# Patient Record
Sex: Female | Born: 1950 | Race: White | Hispanic: No | Marital: Married | Smoking: Never smoker
Health system: Southern US, Community
[De-identification: ages and names within clinical notes are randomized; demographics above are authoritative.]

## PROBLEM LIST (undated history)

## (undated) DIAGNOSIS — F988 Other specified behavioral and emotional disorders with onset usually occurring in childhood and adolescence: Secondary | ICD-10-CM

## (undated) DIAGNOSIS — J189 Pneumonia, unspecified organism: Secondary | ICD-10-CM

## (undated) DIAGNOSIS — M1990 Unspecified osteoarthritis, unspecified site: Secondary | ICD-10-CM

## (undated) DIAGNOSIS — E7800 Pure hypercholesterolemia, unspecified: Secondary | ICD-10-CM

## (undated) DIAGNOSIS — J45909 Unspecified asthma, uncomplicated: Secondary | ICD-10-CM

## (undated) HISTORY — PX: TONSILLECTOMY: SUR1361

## (undated) HISTORY — DX: Pure hypercholesterolemia, unspecified: E78.00

## (undated) HISTORY — DX: Unspecified osteoarthritis, unspecified site: M19.90

## (undated) HISTORY — PX: COLONOSCOPY: SHX174

---

## 1998-07-10 ENCOUNTER — Ambulatory Visit (HOSPITAL_COMMUNITY): Admission: RE | Admit: 1998-07-10 | Discharge: 1998-07-10 | Payer: Self-pay | Admitting: *Deleted

## 1999-12-07 ENCOUNTER — Encounter: Payer: Self-pay | Admitting: *Deleted

## 1999-12-07 ENCOUNTER — Ambulatory Visit (HOSPITAL_COMMUNITY): Admission: RE | Admit: 1999-12-07 | Discharge: 1999-12-07 | Payer: Self-pay | Admitting: *Deleted

## 2002-07-16 ENCOUNTER — Ambulatory Visit (HOSPITAL_COMMUNITY): Admission: RE | Admit: 2002-07-16 | Discharge: 2002-07-16 | Payer: Self-pay | Admitting: Gastroenterology

## 2005-07-18 ENCOUNTER — Encounter: Admission: RE | Admit: 2005-07-18 | Discharge: 2005-10-16 | Payer: Self-pay | Admitting: Family Medicine

## 2005-08-22 ENCOUNTER — Other Ambulatory Visit: Admission: RE | Admit: 2005-08-22 | Discharge: 2005-08-22 | Payer: Self-pay | Admitting: *Deleted

## 2013-12-28 ENCOUNTER — Encounter: Payer: Self-pay | Admitting: Sports Medicine

## 2013-12-28 ENCOUNTER — Ambulatory Visit
Admission: RE | Admit: 2013-12-28 | Discharge: 2013-12-28 | Disposition: A | Discharge status: Home / Self Care | Payer: BC Managed Care – PPO | Source: Ambulatory Visit | Attending: Sports Medicine | Admitting: Sports Medicine

## 2013-12-28 ENCOUNTER — Ambulatory Visit (INDEPENDENT_AMBULATORY_CARE_PROVIDER_SITE_OTHER): Payer: BC Managed Care – PPO | Admitting: Sports Medicine

## 2013-12-28 ENCOUNTER — Encounter (INDEPENDENT_AMBULATORY_CARE_PROVIDER_SITE_OTHER): Payer: Self-pay

## 2013-12-28 VITALS — BP 145/84 | Ht 66.0 in | Wt 135.0 lb

## 2013-12-28 DIAGNOSIS — M25562 Pain in left knee: Secondary | ICD-10-CM

## 2013-12-28 DIAGNOSIS — M25569 Pain in unspecified knee: Secondary | ICD-10-CM

## 2013-12-28 DIAGNOSIS — M1710 Unilateral primary osteoarthritis, unspecified knee: Secondary | ICD-10-CM

## 2013-12-28 DIAGNOSIS — IMO0002 Reserved for concepts with insufficient information to code with codable children: Secondary | ICD-10-CM

## 2013-12-28 DIAGNOSIS — G8929 Other chronic pain: Secondary | ICD-10-CM | POA: Insufficient documentation

## 2013-12-28 DIAGNOSIS — M1712 Unilateral primary osteoarthritis, left knee: Secondary | ICD-10-CM | POA: Insufficient documentation

## 2013-12-28 MED ORDER — TRAMADOL HCL 50 MG PO TABS: 50.0000 mg | ORAL_TABLET | Freq: Two times a day (BID) | ORAL | Status: DC

## 2013-12-28 NOTE — Assessment & Plan Note (Signed)
I think she needs to use supportive brace  We may want to add more shoe support for tennis  SLR program  Reck in 2 mos and see if progressing - consider [place]

## 2013-12-28 NOTE — Assessment & Plan Note (Signed)
Uses a lot of ibuprofen Also lot of tylenol  I think trial of tramadol makes more sense

## 2013-12-28 NOTE — Progress Notes (Signed)
Patient ID: Nichole [name], female   DOB: 02/24/1951, 63 y.o.   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  Last 6 mos swelling Less active and favors this more  Medical Issues Arthritis in several areas - knee, feet, hands, wrist, back Sees Dr Patrecia Pour for OA Vioxx bumped LFTs  Meds  Advil 600 bid with tylenol 1000 bid Ritalin 10 mgm tid  Fam Hx Mom, dad most siblings had OA Colon Cancer  PEXAM Fit in NAD BP 145/84  Ht 5\' 6"  (1.676 m)  Wt 135 lb (61.236 kg)  BMI 21.80 kg/m2  LT Knee There is medial instability on valgus stress Other ligaments are stable Medial jt line hypertrophy Mild pain on McMurry Good flex and extension Mild effusion Varus thrust on walking  RT Knee Knee: Normal to inspection with no erythema or effusion or obvious bony abnormalities. Palpation normal with no warmth or joint line tenderness or patellar tenderness or condyle tenderness. ROM normal in flexion and extension and lower leg rotation. Ligaments with solid consistent endpoints including ACL, PCL, LCL, MCL. Negative Mcmurray's and provocative meniscal tests. Non painful patellar compression. Patellar and quadriceps tendons unremarkable. Hamstring and quadriceps strength is normal.  Hip abductors strong bilaterally Quads are pretty strong but weaker on left  Leg length equal

## 2014-02-14 ENCOUNTER — Ambulatory Visit (INDEPENDENT_AMBULATORY_CARE_PROVIDER_SITE_OTHER): Payer: BC Managed Care – PPO | Admitting: Family Medicine

## 2014-02-14 ENCOUNTER — Encounter: Payer: Self-pay | Admitting: Family Medicine

## 2014-02-14 VITALS — BP 106/71 | Ht 66.0 in | Wt 138.0 lb

## 2014-02-14 DIAGNOSIS — IMO0002 Reserved for concepts with insufficient information to code with codable children: Secondary | ICD-10-CM

## 2014-02-14 DIAGNOSIS — G8929 Other chronic pain: Secondary | ICD-10-CM

## 2014-02-14 DIAGNOSIS — M1712 Unilateral primary osteoarthritis, left knee: Secondary | ICD-10-CM

## 2014-02-14 DIAGNOSIS — M1710 Unilateral primary osteoarthritis, unspecified knee: Secondary | ICD-10-CM

## 2014-02-14 DIAGNOSIS — M25562 Pain in left knee: Secondary | ICD-10-CM

## 2014-02-14 DIAGNOSIS — M25569 Pain in unspecified knee: Secondary | ICD-10-CM

## 2014-02-14 NOTE — Assessment & Plan Note (Signed)
Worsening knee pain. I reviewed her films and her [place] pictures with her. She has a small effusion. I'm not sure why she's having increased symptoms but I suspect part of it is significant worry over what is happening to her knee. We discussed options which include continue brace, increased icing, or more aggressive approach such as aspiration plus/ minus injection of corticosteroid, and she also wanted to discuss possible need for MRI. We discussed all these ultimately she decided to go for the MRI which we will set up and I will discuss with her once I get the results back. In the interim I would recommend she get back to activities. I would recommend she continue  the open patella knee brace with activity.

## 2014-02-14 NOTE — Patient Instructions (Signed)
You have been scheduled for a MRI of your left knee on 02/18/14 at 7:45 am  [facility] [place]  [phone]

## 2014-02-14 NOTE — Progress Notes (Signed)
   Subjective:    Patient ID: Nichole [name], female    DOB: 1950/12/30, 63 y.o.   MRN: [id number]  HPI  Left knee pain worse over the last 1-2 weeks. About 2 weeks ago she noticed increasing pain with tennis so she stopped playing tennis. She then noticed pain with weightbearing and doing everyday activities like shopping for groceries periods are start wearing a brace which seemed to help some. Now feels like her kneecap as well as lot off her knee most of the time she's very worried. She's had no new injury.  PERTINENT  PMH / PSH: No personal Hx DM Not a smoker No hx of left knee injury or surgery Review of Systems She denies fever, sweats, chills, erythema or warmth of the left knee joint.    Objective:   Physical Exam Vital signs are reviewed GENERAL: Well-developed female no acute distress in KNEES: Bilaterally she has him her point patella. Left knee reveals a small effusion. Significant amount of apprehension by patient with exam. Mild tenderness to palpation of the medial joint line. Mild laxity on valgus stress otherwise ligamentously intact. Mild crepitus on patellar grind a significant amount of pain and hypertension. Mild pain on McMurray. She has some pain on full extension but has full extension and normal strength. The extensor 5 out of 5 bilaterally symmetrical as her hamstrings. GAIT: Mildly antalgic. [place]: Medial meniscus is seen well. Lateral meniscus is not seen as well. There is some fluid  in the suprapatellar pouch. Superior pole of  patella shows a small osteophyte. Quadricep and patellar tendon are intact.       Assessment & Plan:

## 2014-02-18 ENCOUNTER — Ambulatory Visit
Admission: RE | Admit: 2014-02-18 | Discharge: 2014-02-18 | Disposition: A | Discharge status: Home / Self Care | Payer: BC Managed Care – PPO | Source: Ambulatory Visit | Attending: Family Medicine | Admitting: Family Medicine

## 2014-02-18 DIAGNOSIS — M25562 Pain in left knee: Secondary | ICD-10-CM

## 2014-02-22 ENCOUNTER — Telehealth: Payer: Self-pay | Admitting: Family Medicine

## 2014-02-22 ENCOUNTER — Encounter: Payer: Self-pay | Admitting: Family Medicine

## 2014-02-22 NOTE — Telephone Encounter (Signed)
In conversation with her.. Results of MRI. Specifically she has a fairly focal area of significant cartilage loss. Think this is probably what is causing her recent symptoms. Menisci actually pretty good. As her desire is to get back to tennis, we discussed options. Certainly she could do nothing, we also discussed steroid injection versus hyaluronic acid injection versus orthopedic referral evaluation. Ultimately a think  she may need some surgical approach and that might be a hemiarthroplasty and I don't know that she wants to go that far yet. I gave her several options issues with think about them let us know.

## 2014-03-01 ENCOUNTER — Ambulatory Visit (INDEPENDENT_AMBULATORY_CARE_PROVIDER_SITE_OTHER): Payer: BC Managed Care – PPO | Admitting: Sports Medicine

## 2014-03-01 ENCOUNTER — Encounter: Payer: Self-pay | Admitting: Sports Medicine

## 2014-03-01 VITALS — BP 120/74

## 2014-03-01 DIAGNOSIS — M1712 Unilateral primary osteoarthritis, left knee: Secondary | ICD-10-CM

## 2014-03-01 DIAGNOSIS — IMO0002 Reserved for concepts with insufficient information to code with codable children: Secondary | ICD-10-CM

## 2014-03-01 DIAGNOSIS — M1710 Unilateral primary osteoarthritis, unspecified knee: Secondary | ICD-10-CM

## 2014-03-01 NOTE — Progress Notes (Signed)
Patient ID: Nichole [name], female   DOB: [birth year], 63 y.o.   MRN: [id number]  Here for left knee OA  MRI confirms a lot of medial compartment loss There is also edema along the medial compartment and some cystic change There is extrusion of the meniscus along the medial anterior joint line  Patient would like to return to playing tennis. She does feel that left knee seems somewhat unstable. Ibuprofen has done a good job relieving her pain. Tramadol did not do as well.  She occasionally gets swelling but not a significant amount. She does not get locking giving way or mechanical symptoms.  A brace with medial and lateral restraints does make her feel more confident  Physical exam No acute distress  LT Knee: Normal to inspection with no erythema or effusion Mild bony hypertrophy along med jt line  Palpation normal with no warmth or joint line tenderness or patellar tenderness or condyle tenderness. ROM normal in flexion and extension and lower leg rotation. On the left she has 145 deg of flexion and gets some pain/ on the RT this is 160 She has joint laxity and so this is a normal ROM but less than on RT  Ligaments with solid consistent endpoints including ACL, PCL, LCL Valgus stress causes the medial compartment to sublux and patient feels this instability when testing MCL . Negative Mcmurray's and provocative meniscal tests. Mildly painful patellar compression. Patellar and quadriceps tendons unremarkable. Hamstring and quadriceps strength is normal.  MRI reviewed On Anterior medial aspect MCL is stretched and meniscus extruded Bony edema and G 4 loss of cartilage as above Minimal spurring

## 2014-03-01 NOTE — Assessment & Plan Note (Signed)
She was advised that in spite of the fact that she has significant arthritis she still functions very well on the left knee and does not have excessive amounts of pain  I think she can keep playing tennis but should be more on soft surfaces She probably needs to alternate every other day I gave her some exercises She should use a compression sleeve but also use the brace with the medial stabilizer when she does play tennis  Since she gets best relief with ibuprofen I suggested she should take enough of this to control her symptoms but if she has to take quite a bit she should be monitored by Dr. Drema Dallas on yearly intervals  She will see me when necessary

## 2014-03-01 NOTE — Patient Instructions (Signed)
Try using a body helix full knee/ small  For tennis use the brace over this to give double protection  Keep up the stationary bike and quad strength work but not too much knee flexion  Don't do squats or lunges  After sport leave on the compression for another 30 minutes and ice  For tennis biggest risk is too deep a knee flexion or moving too quickly side to side  Keep up ibuprofen/ monitor labs with that  Periodically check with me and we will make modifications

## 2014-08-19 ENCOUNTER — Other Ambulatory Visit: Payer: Self-pay | Admitting: Orthopedic Surgery

## 2014-08-19 DIAGNOSIS — M25562 Pain in left knee: Secondary | ICD-10-CM

## 2014-08-31 ENCOUNTER — Ambulatory Visit
Admission: RE | Admit: 2014-08-31 | Discharge: 2014-08-31 | Disposition: A | Discharge status: Home / Self Care | Payer: Commercial Managed Care - HMO | Source: Ambulatory Visit | Attending: Orthopedic Surgery | Admitting: Orthopedic Surgery

## 2014-08-31 ENCOUNTER — Encounter (INDEPENDENT_AMBULATORY_CARE_PROVIDER_SITE_OTHER): Payer: Self-pay

## 2014-08-31 DIAGNOSIS — M25562 Pain in left knee: Secondary | ICD-10-CM

## 2014-09-07 ENCOUNTER — Ambulatory Visit (INDEPENDENT_AMBULATORY_CARE_PROVIDER_SITE_OTHER): Payer: Commercial Managed Care - HMO | Admitting: Neurology

## 2014-09-07 ENCOUNTER — Encounter: Payer: Self-pay | Admitting: Neurology

## 2014-09-07 VITALS — BP 124/71 | HR 65 | Temp 97.9°F | Ht 66.0 in | Wt 143.0 lb

## 2014-09-07 DIAGNOSIS — F988 Other specified behavioral and emotional disorders with onset usually occurring in childhood and adolescence: Secondary | ICD-10-CM

## 2014-09-07 DIAGNOSIS — M25569 Pain in unspecified knee: Secondary | ICD-10-CM

## 2014-09-07 DIAGNOSIS — M25562 Pain in left knee: Secondary | ICD-10-CM

## 2014-09-07 DIAGNOSIS — G4710 Hypersomnia, unspecified: Secondary | ICD-10-CM

## 2014-09-07 NOTE — Progress Notes (Signed)
Subjective:    Patient ID: Nichole [name] is a 63 y.o. female.  HPI    Star Age, MD, PhD [facility] [address] [place], [place] [address]  Dear Dr. Drema Dallas,     I saw your patient, Nichole [name], upon your kind request in my neurologic clinic today for initial consultation of her sleep disorder, in particular, her excessive daytime somnolence. The patient is unaccompanied today. As you know, Nichole [name] is a 63 year old right-handed woman with an underlying medical history of seasonal asthma, ADD (on Ritalin 10 mg tid per her Gyn) and osteoarthritis (scheduled for left knee surgery next month under Dr. Maureen Ralphs), who reported excessive sleepiness for the past many years. She has been on Ritalin for over 20 years and has noted that with Ritalin she focusses well and feels better, but whenever she reduces it or ran out of it, she becomes very sleep.  However, she recalls, that in her 89s, when she was not on Ritalin, she would be very sleepy, driving for one hour to get home and often came close to falling asleep at the wheel.  She is one of 6 children, and there are mood disorder, substance abuse in her siblings, and suspected ADD. Her son has ADD.  Her typical bedtime is reported to be around 11:30 PM and usual wake time is around 6:30 to 7 AM. Sleep onset typically occurs within 15 minutes. She reports feeling marginally rested upon awakening. She wakes up on an average 1 times in the middle of the night and has to go to the bathroom 1 times on a typical night. She denies morning headaches.  She reports excessive daytime somnolence (EDS) without Ritalin and with Ritalin her ESS is 4/24, and she estimates her ESS without Ritalin to be 10/24. She has not fallen asleep while driving. The patient has not been taking a planned nap, but has napped when she ran out of Ritalin. She does not recall dreaming in a nap.  She has been known to snore for the  past few years. Snoring is reportedly mild, and not associated with choking sounds and witnessed apneas. The patient denies a sense of choking or strangling feeling. There is no report of nighttime reflux, with no nighttime cough experienced. The patient has not noted any RLS symptoms and is not known to kick while asleep or before falling asleep. There is no family history of RLS or OSA or narcolepsy.  She denies cataplexy, sleep paralysis, hypnagogic or hypnopompic hallucinations, or sleep attacks. She does not report any vivid dreams, nightmares, dream enactments, or parasomnias, such as sleep talking or sleep walking. The patient has not had a sleep study or a home sleep test.  She consumes 3 caffeinated beverages per day, usually in the form of coffee in the morning.  Her bedroom is usually dark and cool. There is a TV in the bedroom and usually it is not on at night.   Her Past Medical History Is Significant For: Past Medical History  Diagnosis Date  . High cholesterol     Her Past Surgical History Is Significant For: Past Surgical History  Procedure Laterality Date  . Tonsillectomy      age 61    Her Family History Is Significant For: Family History  Problem Relation Age of Onset  . Cancer Mother     uterine  . Dementia Mother   . Cancer - Colon Father   . COPD Father   .  Fibromyalgia Sister   . Cancer - Colon Brother   . Hepatitis C Brother   . Cancer Maternal Grandmother     kidney  . Dementia Maternal Grandfather   . Neuropathy Son 30    multi focal motor neuropathy    Her Social History Is Significant For: History   Social History  . Marital Status: Married    Spouse Name: [name]    Number of Children: 2  . Years of Education: BS   Occupational History  .      self employed   Social History Main Topics  . Smoking status: Never Smoker   . Smokeless tobacco: Never Used  . Alcohol Use: No  . Drug Use: No  . Sexual Activity: None   Other Topics Concern  .  None   Social History Narrative   Patient is right handed and resides with husband, consumes 2-3 cups of caffeine daily    Her Allergies Are:  Allergies  Allergen Reactions  . Sulfa Antibiotics     Hives and swelling  :   Her Current Medications Are:  Outpatient Encounter Prescriptions as of 09/07/2014  Medication Sig  . Acetaminophen (TYLENOL EXTRA STRENGTH PO) Take 500 mg by mouth. 2 daily  . aspirin 81 MG tablet Take 81 mg by mouth daily.  . Calcium Citrate-Vitamin D (CALCIUM CITRATE + D3 PO) Take by mouth. One am, one pm  . cholecalciferol (VITAMIN D) 1000 UNITS tablet Take 1,000 Units by mouth daily.  . Fish Oil OIL by Does not apply route. Twice daily  . ibuprofen (ADVIL,MOTRIN) 200 MG tablet Take 200 mg by mouth every 6 (six) hours as needed. Total of 6 daily  . loratadine (CLARITIN) 10 MG tablet Take 10 mg by mouth daily.  . methylphenidate (RITALIN) 10 MG tablet Take 10 mg by mouth 3 (three) times daily with meals.  . Red Yeast Rice Extract (RED YEAST RICE PO) Take by mouth 2 (two) times daily. Plus COQ10  . [DISCONTINUED] traMADol (ULTRAM) 50 MG tablet Take 1 tablet (50 mg total) by mouth 2 (two) times daily.  :  Review of Systems:  Out of a complete 14 point review of systems, all are reviewed and negative with the exception of these symptoms as listed below:   Review of Systems  Musculoskeletal: Positive for joint swelling.       Joint pain  Neurological:       Sleepiness  Psychiatric/Behavioral:       Decreased energy    Objective:  Neurologic Exam  Physical Exam Physical Examination:   Filed Vitals:   09/07/14 1013  BP: 124/71  Pulse: 65  Temp: 97.9 F (36.6 C)    General Examination: The patient is a very pleasant 63 y.o. female in no acute distress. She appears well-developed and well-nourished and well groomed.   HEENT: Normocephalic, atraumatic, pupils are equal, round and reactive to light and accommodation. Funduscopic exam is normal with  sharp disc margins noted. Extraocular tracking is good without limitation to gaze excursion or nystagmus noted. Normal smooth pursuit is noted. Hearing is grossly intact. Tympanic membranes are clear bilaterally. Face is symmetric with normal facial animation and normal facial sensation. Speech is clear with no dysarthria noted. There is no hypophonia. There is no lip, neck/head, jaw or voice tremor. Neck is supple with full range of passive and active motion. There are no carotid bruits on auscultation. Oropharynx exam reveals: mild mouth dryness, adequate dental hygiene and mild airway crowding, due  to narrow airway and redundant soft palate.   Chest: Clear to auscultation without wheezing, rhonchi or crackles noted.  Heart: S1+S2+0, regular and normal without murmurs, rubs or gallops noted.   Abdomen: Soft, non-tender and non-distended with normal bowel sounds appreciated on auscultation.  Extremities: There is no pitting edema in the distal lower extremities bilaterally. Pedal pulses are intact.  Skin: Warm and dry without trophic changes noted. There are no varicose veins.  Musculoskeletal: exam reveals no obvious joint deformities, tenderness or joint swelling or erythema, with the exception of left knee pain and she is wearing a soft elastic brace around it. Her left distal leg is slightly larger in caliber than the right with no obvious varicose vein or swelling or pitting edema per se.   Neurologically:  Mental status: The patient is awake, alert and oriented in all 4 spheres. Her immediate and remote memory, attention, language skills and fund of knowledge are appropriate. There is no evidence of aphasia, agnosia, apraxia or anomia. Speech is clear with normal prosody and enunciation. Thought process is linear. Mood is normal and affect is normal.  Cranial nerves II - XII are as described above under HEENT exam. In addition: shoulder shrug is normal with equal shoulder height noted. Motor  exam: Normal bulk, strength and tone is noted. There is no drift, tremor or rebound. Romberg is negative. Reflexes are 2+ throughout. Babinski: Toes are flexor bilaterally. Fine motor skills and coordination: intact with normal finger taps, normal hand movements, normal rapid alternating patting, normal foot taps and normal foot agility.  Cerebellar testing: No dysmetria or intention tremor on finger to nose testing. Heel to shin is unremarkable bilaterally, with the exception of mild pain reported in her left knee. There is no truncal or gait ataxia.  Sensory exam: intact to light touch, pinprick, vibration, temperature sense in the upper and lower extremities.  Gait, station and balance: She stands easily. No veering to one side is noted. No leaning to one side is noted. Posture is age-appropriate and stance is narrow based. Gait shows normal stride length and normal pace, but has a slight limp on the L. No problems turning are noted. She turns en bloc. Tandem walk is unremarkable. Intact toe and heel stance is noted.               Assessment and Plan:   In summary, Nichole [name] is a very pleasant 63 y.o.-year old female with an underlying medical history of seasonal asthma, ADD (on Ritalin 10 mg tid per her Gyn) and osteoarthritis (scheduled for left knee surgery next month under Dr. Maureen Ralphs), who reports a long-standing history of excessive somnolence. She does not have a telltale history concerning for narcolepsy. She has been on Ritalin for over 20 years and whenever she runs out of it or skips a dose she becomes quite lethargic but she has never actually stopped it for any consistent amount of time. Looking back in her history when she was in her 69s and became quite sleepy while driving for an hour, this does raise a suspicion for an underlying sleepiness disorder such as idiopathic hypersomnolence. Again, her history is certainly not telltale for narcolepsy. To further delineate her sleepiness, I  suggested she taper off of Ritalin and be off of it for at least 10-14 days and we can schedule her for a overnight polysomnogram followed by a nap study which would be the gold standard to further determine the degree of daytime somnolence and sleepiness  she may have. Given that she has a surgery coming up she would like to hold off on sleep testing and perhaps consider it when she has undergone her scheduled knee surgery and has recovered from it. I agree that there is no pressing reason that we need to test her before her surgery. Her physical and neurological exam are nonfocal with the exception of left knee pain and mild difficulty with range of motion in her left leg.  At this juncture, I have advised patient to call us back when she is ready to embark on further testing. I would be happy to see her back after that. She was in agreement. I answered all her questions today in particular also explaining the sleep test procedure and the MSLT.  Thank you very much for allowing me to participate in the care of this nice lady. If I can be of further assistance to you please do not hesitate to e-mail or call.  Star Age, MD, PhD

## 2014-09-07 NOTE — Patient Instructions (Addendum)
We will consider sleep testing in the near future. Please call us when you are ready to schedule your sleep tests. You would have to taper off of ritalin: Take 1 pill 2 times a day for 1 week, then 1 pill once daily for 1 week, then 1 pill every other day for 1 week, then stop. You will have to be off of it for at least 10 days. You should not be on an antidepressant, narcotic pain medication or a valium like medication for this time either.

## 2014-10-18 ENCOUNTER — Ambulatory Visit: Payer: Self-pay | Admitting: Orthopedic Surgery

## 2014-10-18 NOTE — Progress Notes (Signed)
Preoperative surgical orders have been place into the Epic hospital system for Nichole [name] on 10/18/2014, 5:38 PM  by Mickel Crow for surgery on 11/07/2014.  Preop Uni Knee orders including Experal, IV Tylenol, and IV Decadron as long as there are no contraindications to the above medications. Arlee Muslim, PA-C

## 2014-10-26 NOTE — Patient Instructions (Addendum)
Nichole [name]  10/26/2014   Your procedure is scheduled on:  11/07/2014    Come thru the [facility].  .  Follow the Signs to [facility] at  [place]      am  Call this number if you have problems the morning of surgery: [phone]   Remember:   Do not eat food or drink liquids after midnight.   Take these medicines the morning of surgery with A SIP OF WATER: Use Albuterol Inhaler if needed. Bring Inhaler with you, Claritin    Do not wear jewelry, make-up or nail polish.  Do not wear lotions, powders, or perfumes.  deodorant.  Do not shave 48 hours prior to surgery.   Do not bring valuables to the hospital.  Contacts, dentures or bridgework may not be worn into surgery.  Leave suitcase in the car. After surgery it may be brought to your room.  For patients admitted to the hospital, checkout time is 11:00 AM the day of  discharge.     Please read over the following fact sheets that you were given: MRSA Information, coughing and deep breathing exercises, leg exercises             - Preparing for Surgery Before surgery, you can play an important role.  Because skin is not sterile, your skin needs to be as free of germs as possible.  You can reduce the number of germs on your skin by washing with CHG (chlorahexidine gluconate) soap before surgery.  CHG is an antiseptic cleaner which kills germs and bonds with the skin to continue killing germs even after washing. Please DO NOT use if you have an allergy to CHG or antibacterial soaps.  If your skin becomes reddened/irritated stop using the CHG and inform your nurse when you arrive at Short Stay. Do not shave (including legs and underarms) for at least 48 hours prior to the first CHG shower.  You may shave your face/neck. Please follow these instructions carefully:  1.  Shower with CHG Soap the night before surgery and the  morning of Surgery.  2.  If you choose to wash your hair, wash your hair first as usual with  your  normal  shampoo.  3.  After you shampoo, rinse your hair and body thoroughly to remove the  shampoo.                           4.  Use CHG as you would any other liquid soap.  You can apply chg directly  to the skin and wash                       Gently with a scrungie or clean washcloth.  5.  Apply the CHG Soap to your body ONLY FROM THE NECK DOWN.   Do not use on face/ open                           Wound or open sores. Avoid contact with eyes, ears mouth and genitals (private parts).                       Wash face,  Genitals (private parts) with your normal soap.             6.  Wash thoroughly, paying special attention to the area where your surgery  will be performed.  7.  Thoroughly rinse your body with warm water from the neck down.  8.  DO NOT shower/wash with your normal soap after using and rinsing off  the CHG Soap.                9.  Pat yourself dry with a clean towel.            10.  Wear clean pajamas.            11.  Place clean sheets on your bed the night of your first shower and do not  sleep with pets. Day of Surgery : Do not apply any lotions/deodorants the morning of surgery.  Please wear clean clothes to the hospital/surgery center.  FAILURE TO FOLLOW THESE INSTRUCTIONS MAY RESULT IN THE CANCELLATION OF YOUR SURGERY PATIENT SIGNATURE_________________________________  NURSE SIGNATURE__________________________________  ________________________________________________________________________  WHAT IS A BLOOD TRANSFUSION? Blood Transfusion Information  A transfusion is the replacement of blood or some of its parts. Blood is made up of multiple cells which provide different functions.  Red blood cells carry oxygen and are used for blood loss replacement.  White blood cells fight against infection.  Platelets control bleeding.  Plasma helps clot blood.  Other blood products are available for specialized needs, such as hemophilia or other clotting  disorders. BEFORE THE TRANSFUSION  Who gives blood for transfusions?   Healthy volunteers who are fully evaluated to make sure their blood is safe. This is blood bank blood. Transfusion therapy is the safest it has ever been in the practice of medicine. Before blood is taken from a donor, a complete history is taken to make sure that person has no history of diseases nor engages in risky social behavior (examples are intravenous drug use or sexual activity with multiple partners). The donor's travel history is screened to minimize risk of transmitting infections, such as malaria. The donated blood is tested for signs of infectious diseases, such as HIV and hepatitis. The blood is then tested to be sure it is compatible with you in order to minimize the chance of a transfusion reaction. If you or a relative donates blood, this is often done in anticipation of surgery and is not appropriate for emergency situations. It takes many days to process the donated blood. RISKS AND COMPLICATIONS Although transfusion therapy is very safe and saves many lives, the main dangers of transfusion include:  1. Getting an infectious disease. 2. Developing a transfusion reaction. This is an allergic reaction to something in the blood you were given. Every precaution is taken to prevent this. The decision to have a blood transfusion has been considered carefully by your caregiver before blood is given. Blood is not given unless the benefits outweigh the risks. AFTER THE TRANSFUSION  Right after receiving a blood transfusion, you will usually feel much better and more energetic. This is especially true if your red blood cells have gotten low (anemic). The transfusion raises the level of the red blood cells which carry oxygen, and this usually causes an energy increase.  The nurse administering the transfusion will monitor you carefully for complications. HOME CARE INSTRUCTIONS  No special instructions are needed after a  transfusion. You may find your energy is better. Speak with your caregiver about any limitations on activity for underlying diseases you may have. SEEK MEDICAL CARE IF:   Your condition is not improving after your transfusion.  You develop redness or irritation at the intravenous (IV) site. SEEK IMMEDIATE  MEDICAL CARE IF:  Any of the following symptoms occur over the next 12 hours:  Shaking chills.  You have a temperature by mouth above 102 F (38.9 C), not controlled by medicine.  Chest, back, or muscle pain.  People around you feel you are not acting correctly or are confused.  Shortness of breath or difficulty breathing.  Dizziness and fainting.  You get a rash or develop hives.  You have a decrease in urine output.  Your urine turns a dark color or changes to pink, red, or brown. Any of the following symptoms occur over the next 10 days:  You have a temperature by mouth above 102 F (38.9 C), not controlled by medicine.  Shortness of breath.  Weakness after normal activity.  The white part of the eye turns yellow (jaundice).  You have a decrease in the amount of urine or are urinating less often.  Your urine turns a dark color or changes to pink, red, or brown. Document Released: 12/06/2000 Document Revised: 03/02/2012 Document Reviewed: 07/25/2008 ExitCare Patient Information 2014 [place].  _______________________________________________________________________  Incentive Spirometer  An incentive spirometer is a tool that can help keep your lungs clear and active. This tool measures how well you are filling your lungs with each breath. Taking long deep breaths may help reverse or decrease the chance of developing breathing (pulmonary) problems (especially infection) following:  A long period of time when you are unable to move or be active. BEFORE THE PROCEDURE   If the spirometer includes an indicator to show your best effort, your nurse or  respiratory therapist will set it to a desired goal.  If possible, sit up straight or lean slightly forward. Try not to slouch.  Hold the incentive spirometer in an upright position. INSTRUCTIONS FOR USE  3. Sit on the edge of your bed if possible, or sit up as far as you can in bed or on a chair. 4. Hold the incentive spirometer in an upright position. 5. Breathe out normally. 6. Place the mouthpiece in your mouth and seal your lips tightly around it. 7. Breathe in slowly and as deeply as possible, raising the piston or the ball toward the top of the column. 8. Hold your breath for 3-5 seconds or for as long as possible. Allow the piston or ball to fall to the bottom of the column. 9. Remove the mouthpiece from your mouth and breathe out normally. 10. Rest for a few seconds and repeat Steps 1 through 7 at least 10 times every 1-2 hours when you are awake. Take your time and take a few normal breaths between deep breaths. 11. The spirometer may include an indicator to show your best effort. Use the indicator as a goal to work toward during each repetition. 12. After each set of 10 deep breaths, practice coughing to be sure your lungs are clear. If you have an incision (the cut made at the time of surgery), support your incision when coughing by placing a pillow or rolled up towels firmly against it. Once you are able to get out of bed, walk around indoors and cough well. You may stop using the incentive spirometer when instructed by your caregiver.  RISKS AND COMPLICATIONS  Take your time so you do not get dizzy or light-headed.  If you are in pain, you may need to take or ask for pain medication before doing incentive spirometry. It is harder to take a deep breath if you are having pain. AFTER USE  Rest and breathe slowly and easily.  It can be helpful to keep track of a log of your progress. Your caregiver can provide you with a simple table to help with this. If you are using the  spirometer at home, follow these instructions: [place] IF:   You are having difficultly using the spirometer.  You have trouble using the spirometer as often as instructed.  Your pain medication is not giving enough relief while using the spirometer.  You develop fever of 100.5 F (38.1 C) or higher. SEEK IMMEDIATE MEDICAL CARE IF:   You cough up bloody sputum that had not been present before.  You develop fever of 102 F (38.9 C) or greater.  You develop worsening pain at or near the incision site. MAKE SURE YOU:   Understand these instructions.  Will watch your condition.  Will get help right away if you are not doing well or get worse. Document Released: 04/21/2007 Document Revised: 03/02/2012 Document Reviewed: 06/22/2007 [facility] Patient Information 2014 [place], [place].   ________________________________________________________________________

## 2014-10-27 ENCOUNTER — Encounter (HOSPITAL_COMMUNITY): Payer: Self-pay

## 2014-10-27 ENCOUNTER — Encounter (HOSPITAL_COMMUNITY)
Admission: RE | Admit: 2014-10-27 | Discharge: 2014-10-27 | Disposition: A | Discharge status: Home / Self Care | Payer: Commercial Managed Care - HMO | Source: Ambulatory Visit | Attending: Orthopedic Surgery | Admitting: Orthopedic Surgery

## 2014-10-27 DIAGNOSIS — Z01812 Encounter for preprocedural laboratory examination: Secondary | ICD-10-CM | POA: Insufficient documentation

## 2014-10-27 HISTORY — DX: Pneumonia, unspecified organism: J18.9

## 2014-10-27 HISTORY — DX: Other specified behavioral and emotional disorders with onset usually occurring in childhood and adolescence: F98.8

## 2014-10-27 HISTORY — DX: Unspecified osteoarthritis, unspecified site: M19.90

## 2014-10-27 HISTORY — DX: Unspecified asthma, uncomplicated: J45.909

## 2014-10-27 LAB — COMPREHENSIVE METABOLIC PANEL
ALBUMIN: 4.1 g/dL (ref 3.5–5.2)
ALT: 18 U/L (ref 0–35)
ANION GAP: 9 (ref 5–15)
AST: 25 U/L (ref 0–37)
Alkaline Phosphatase: 70 U/L (ref 39–117)
BUN: 19 mg/dL (ref 6–23)
CALCIUM: 10.2 mg/dL (ref 8.4–10.5)
CO2: 31 mEq/L (ref 19–32)
CREATININE: 0.85 mg/dL (ref 0.50–1.10)
Chloride: 97 mEq/L (ref 96–112)
GFR calc Af Amer: 82 mL/min — ABNORMAL LOW (ref >90)
GFR calc non Af Amer: 70 mL/min — ABNORMAL LOW (ref >90)
Glucose, Bld: 99 mg/dL (ref 70–99)
Potassium: 4.3 mEq/L (ref 3.7–5.3)
Sodium: 137 mEq/L (ref 137–147)
Total Bilirubin: 0.6 mg/dL (ref 0.3–1.2)
Total Protein: 7 g/dL (ref 6.0–8.3)

## 2014-10-27 LAB — APTT: aPTT: 27 seconds (ref 24–37)

## 2014-10-27 LAB — PROTIME-INR
INR: 0.97 (ref 0.00–1.49)
PROTHROMBIN TIME: 13 s (ref 11.6–15.2)

## 2014-10-27 LAB — CBC
HEMATOCRIT: 40.1 % (ref 36.0–46.0)
Hemoglobin: 13.7 g/dL (ref 12.0–15.0)
MCH: 30 pg (ref 26.0–34.0)
MCHC: 34.2 g/dL (ref 30.0–36.0)
MCV: 87.9 fL (ref 78.0–100.0)
PLATELETS: 251 10*3/uL (ref 150–400)
RBC: 4.56 MIL/uL (ref 3.87–5.11)
RDW: 12.7 % (ref 11.5–15.5)
WBC: 5 10*3/uL (ref 4.0–10.5)

## 2014-10-27 LAB — URINALYSIS, ROUTINE W REFLEX MICROSCOPIC
BILIRUBIN URINE: NEGATIVE
Glucose, UA: NEGATIVE mg/dL
Ketones, ur: NEGATIVE mg/dL
NITRITE: NEGATIVE
PROTEIN: NEGATIVE mg/dL
SPECIFIC GRAVITY, URINE: 1.017 (ref 1.005–1.030)
Urobilinogen, UA: 0.2 mg/dL (ref 0.0–1.0)
pH: 6.5 (ref 5.0–8.0)

## 2014-10-27 LAB — URINE MICROSCOPIC-ADD ON

## 2014-10-27 LAB — SURGICAL PCR SCREEN
MRSA, PCR: NEGATIVE
STAPHYLOCOCCUS AUREUS: NEGATIVE

## 2014-10-27 NOTE — Progress Notes (Signed)
Clearance- Dr Leighton Ruff- 09/01/14 on chart  EKG- 09/01/14 on chart  OV with Neurology- 09/07/14 EPIC

## 2014-10-27 NOTE — Progress Notes (Signed)
Micro ua results faxed by epic to dr aluisio  

## 2014-11-06 ENCOUNTER — Ambulatory Visit: Payer: Self-pay | Admitting: Orthopedic Surgery

## 2014-11-06 NOTE — H&P (Signed)
Nichole [name] DOB: Jun 27, 1951 Married / Language: English / Race: White Female Date of Admission:  11/07/2014 CC: Left Knee Pain History of Present Illness The patient is a 63 year old female who comes in for a preoperative History and Physical. The patient is scheduled for a left unicompartmental replacement to be performed by Dr. Dione Plover. Aluisio, MD at [facility] on 11/07/2014. The patient is a 63 year old female who presents for follow up of their knee. The patient is being followed for their left knee pain and osteoarthritis. They are several months out from Synvisc series. Symptoms reported today include: pain, swelling, instability and pain with weightbearing. The patient feels that they are doing poorly. The following medication has been used for pain control: antiinflammatory medication (Advil). The patient has not gotten any relief of their symptoms with viscosupplementation. The patient indicates that they have questions or concerns today regarding their progress at this point. [name] did not have any response to the visco supplements. She now has had cortisone and visco supplements. She is having pain at all times. The pain is all along the medial aspect of her knee. She is not having any instability symptoms. She is not getting any significant swelling. She was informed that she is a perfect candidate for this operation, given her excellent ROM, excellent stability and isolated medial compartment disease. I told her that it should allow for ability to do more functional activities than a total knee. We had a long discussion about this, and she's more comfortable going ahead and doing the unicompartmental replacement. We will proceed with surgery as planned. They have been treated conservatively in the past for the above stated problem and despite conservative measures, they continue to have progressive pain and severe functional limitations and dysfunction. They have failed  non-operative management including home exercise, medications, and injections. It is felt that they would benefit from undergoing total joint replacement. Risks and benefits of the procedure have been discussed with the patient and they elect to proceed with surgery. There are no active contraindications to surgery such as ongoing infection or rapidly progressive neurological disease.  Allergies  Sulfa Drugs Hives, Swelling.  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 child Cancer Brother, Father, Maternal Grandmother, Mother. First Degree Relatives reported Osteoarthritis Brother, Father, Maternal Grandmother, Mother, Sister. Osteoporosis Father. Chronic Obstructive Lung Disease Father. Depression Brother, Mother, Sister. Drug / Alcohol Addiction Brother.  Social History Exercise Exercises daily; does individual sport and gym / weights Living situation live with spouse Marital status married Not under pain contract Never consumed alcohol 05/17/2014: Never consumed alcohol Number of flights of stairs before winded 2-3 Children 2 Tobacco use Never smoker. 05/17/2014 Current work status working part time No history of drug/alcohol rehab  Medication History  Advil (Oral) Specific dose unknown - Active.  Past Surgical History  Colon Polyp Removal - Colonoscopy Tonsillectomy Date: 85.  Review of Systems General Not Present- Chills, Fatigue, Fever, Memory Loss, Night Sweats, Weight Gain and Weight Loss. Skin Not Present- Eczema, Hives, Itching, Lesions and Rash. HEENT Not Present- Dentures, Double Vision, Headache, Hearing Loss, Tinnitus  and Visual Loss. Respiratory Not Present- Allergies, Chronic Cough, Coughing up blood, Shortness of breath at rest and Shortness of breath with exertion. Cardiovascular Not Present- Chest  Pain, Difficulty Breathing Lying Down, Murmur, Palpitations, Racing/skipping heartbeats and Swelling. Gastrointestinal Not Present- Abdominal Pain, Bloody Stool, Constipation, Diarrhea, Difficulty Swallowing, Heartburn, Jaundice, Loss of appetitie, Nausea and Vomiting. Female Genitourinary Not Present- Blood in Urine, Discharge, Flank Pain, Incontinence, Painful Urination, Urgency, Urinary frequency, Urinary Retention, Urinating at Night and Weak urinary stream. Musculoskeletal Present- Morning Stiffness. Not Present- Back Pain, Joint Pain, Joint Swelling, Muscle Pain, Muscle Weakness and Spasms. Neurological Not Present- Blackout spells, Difficulty with balance, Dizziness, Paralysis, Tremor and Weakness. Psychiatric Not Present- Insomnia.  Vitals Weight: 139 lb Height: 66in Weight was reported by patient. Height was reported by patient. Body Surface Area: 1.71 m Body Mass Index: 22.43 kg/m  Pulse: 68 (Regular)  BP: 116/70 (Sitting, Right Arm, Standard)  Physical Exam  General Mental Status -Alert, cooperative and good historian. General Appearance-pleasant, Not in acute distress. Orientation-Oriented X3. [place], Well nourished and Well developed.  Head and Neck Head-normocephalic, atraumatic . Neck Global Assessment - supple, no bruit auscultated on the right, no bruit auscultated on the left.  Eye Vision-Wears corrective lenses. Pupil - Bilateral-Regular and Round. Motion - Bilateral-EOMI.  Chest and Lung Exam Auscultation Breath sounds - clear at anterior chest wall and clear at posterior chest wall. Adventitious sounds - No Adventitious sounds.  Cardiovascular Auscultation Rhythm - Regular rate and rhythm. Heart Sounds - S1 WNL and S2 WNL.  Murmurs & Other Heart Sounds - Auscultation of the heart reveals - No Murmurs.  Abdomen Palpation/Percussion Tenderness - Abdomen is non-tender to palpation. Rigidity (guarding) - Abdomen is soft. Auscultation Auscultation of the abdomen reveals - Bowel sounds normal.  Female Genitourinary Note: Not done, not pertinent to present illness   Musculoskeletal Note: On exam, well-developed female, in no distress. Her left knee shows a tiny effusion. Her ROM is 0-130 degrees. She has significant medial tenderness. There is no lateral tenderness or instability noted.  RADIOGRAPHS: Her radiographs are reviewed from 2 weeks ago, and she is essentially bone on bone in the medial compartment with a normal lateral compartment and normal patellofemoral compartment.  Assessment & Plan Primary localized osteoarthritis of left knee (M17.12) Note:Plan is for a Left Unicompartment Knee Replacement by Dr. Wynelle Link.  Plan is to go home.  PCP - Dr. Leighton Ruff  The patient does not have any contraindications and will receive TXA (tranexamic acid) prior to surgery.  Signed electronically by Joelene Millin, III PA-C

## 2014-11-07 ENCOUNTER — Inpatient Hospital Stay (HOSPITAL_COMMUNITY): Payer: Medicare HMO | Admitting: Anesthesiology

## 2014-11-07 ENCOUNTER — Encounter (HOSPITAL_COMMUNITY): Payer: Self-pay | Admitting: *Deleted

## 2014-11-07 ENCOUNTER — Encounter (HOSPITAL_COMMUNITY)
Admission: RE | Disposition: A | Discharge status: Home Health | Payer: Self-pay | Source: Ambulatory Visit | Attending: Orthopedic Surgery

## 2014-11-07 ENCOUNTER — Inpatient Hospital Stay (HOSPITAL_COMMUNITY)
Admission: RE | Admit: 2014-11-07 | Discharge: 2014-11-08 | DRG: 470 | Disposition: A | Discharge status: Home Health | Payer: Medicare HMO | Source: Ambulatory Visit | Attending: Orthopedic Surgery | Admitting: Orthopedic Surgery

## 2014-11-07 DIAGNOSIS — Z7982 Long term (current) use of aspirin: Secondary | ICD-10-CM

## 2014-11-07 DIAGNOSIS — M179 Osteoarthritis of knee, unspecified: Secondary | ICD-10-CM | POA: Diagnosis present

## 2014-11-07 DIAGNOSIS — M1710 Unilateral primary osteoarthritis, unspecified knee: Secondary | ICD-10-CM | POA: Diagnosis present

## 2014-11-07 DIAGNOSIS — M25562 Pain in left knee: Secondary | ICD-10-CM | POA: Diagnosis present

## 2014-11-07 DIAGNOSIS — Z01812 Encounter for preprocedural laboratory examination: Secondary | ICD-10-CM | POA: Diagnosis not present

## 2014-11-07 DIAGNOSIS — M1712 Unilateral primary osteoarthritis, left knee: Secondary | ICD-10-CM | POA: Diagnosis present

## 2014-11-07 HISTORY — PX: PARTIAL KNEE ARTHROPLASTY: SHX2174

## 2014-11-07 LAB — TYPE AND SCREEN
ABO/RH(D): A POS
Antibody Screen: NEGATIVE

## 2014-11-07 LAB — ABO/RH: ABO/RH(D): A POS

## 2014-11-07 SURGERY — ARTHROPLASTY, KNEE, UNICOMPARTMENTAL
Anesthesia: Spinal | Site: Knee | Laterality: Left

## 2014-11-07 MED ORDER — BUPIVACAINE IN DEXTROSE 0.75-8.25 % IT SOLN
INTRATHECAL | Status: DC | PRN
  Administered 2014-11-07: 2 mL via INTRATHECAL

## 2014-11-07 MED ORDER — PROPOFOL INFUSION 10 MG/ML OPTIME
INTRAVENOUS | Status: DC | PRN
  Administered 2014-11-07: 75 ug/kg/min via INTRAVENOUS

## 2014-11-07 MED ORDER — FENTANYL CITRATE 0.05 MG/ML IJ SOLN
INTRAMUSCULAR | Status: AC
  Filled 2014-11-07: qty 2

## 2014-11-07 MED ORDER — ONDANSETRON HCL 4 MG/2ML IJ SOLN: 4.0000 mg | Freq: Four times a day (QID) | INTRAMUSCULAR | Status: DC | PRN

## 2014-11-07 MED ORDER — ONDANSETRON HCL 4 MG/2ML IJ SOLN
INTRAMUSCULAR | Status: DC | PRN
  Administered 2014-11-07: 4 mg via INTRAVENOUS

## 2014-11-07 MED ORDER — ACETAMINOPHEN 650 MG RE SUPP: 650.0000 mg | Freq: Four times a day (QID) | RECTAL | Status: DC | PRN

## 2014-11-07 MED ORDER — PHENYLEPHRINE HCL 10 MG/ML IJ SOLN: INTRAMUSCULAR | Status: DC | PRN

## 2014-11-07 MED ORDER — PROPOFOL 10 MG/ML IV BOLUS
INTRAVENOUS | Status: DC | PRN
  Administered 2014-11-07: 10 mg via INTRAVENOUS
  Administered 2014-11-07 (×2): 20 mg via INTRAVENOUS

## 2014-11-07 MED ORDER — LACTATED RINGERS IV SOLN: INTRAVENOUS | Status: DC

## 2014-11-07 MED ORDER — TRANEXAMIC ACID 100 MG/ML IV SOLN
1000.0000 mg | INTRAVENOUS | Status: AC
  Administered 2014-11-07: 1000 mg via INTRAVENOUS
  Filled 2014-11-07: qty 10

## 2014-11-07 MED ORDER — PHENYLEPHRINE HCL 10 MG/ML IJ SOLN
INTRAMUSCULAR | Status: DC | PRN
  Administered 2014-11-07 (×2): 80 ug via INTRAVENOUS

## 2014-11-07 MED ORDER — CEFAZOLIN SODIUM-DEXTROSE 2-3 GM-% IV SOLR
INTRAVENOUS | Status: AC
  Filled 2014-11-07: qty 50

## 2014-11-07 MED ORDER — TRAMADOL HCL 50 MG PO TABS: 50.0000 mg | ORAL_TABLET | Freq: Four times a day (QID) | ORAL | Status: DC | PRN

## 2014-11-07 MED ORDER — LORATADINE 10 MG PO TABS
10.0000 mg | ORAL_TABLET | Freq: Every morning | ORAL | Status: DC
  Administered 2014-11-08: 10 mg via ORAL
  Filled 2014-11-07: qty 1

## 2014-11-07 MED ORDER — SODIUM CHLORIDE 0.9 % IV SOLN: INTRAVENOUS | Status: DC

## 2014-11-07 MED ORDER — MIDAZOLAM HCL 2 MG/2ML IJ SOLN
INTRAMUSCULAR | Status: AC
  Filled 2014-11-07: qty 2

## 2014-11-07 MED ORDER — DEXAMETHASONE SODIUM PHOSPHATE 10 MG/ML IJ SOLN
10.0000 mg | Freq: Once | INTRAMUSCULAR | Status: AC
  Administered 2014-11-07: 10 mg via INTRAVENOUS

## 2014-11-07 MED ORDER — FENTANYL CITRATE 0.05 MG/ML IJ SOLN
INTRAMUSCULAR | Status: DC | PRN
  Administered 2014-11-07 (×2): 50 ug via INTRAVENOUS

## 2014-11-07 MED ORDER — DEXAMETHASONE SODIUM PHOSPHATE 10 MG/ML IJ SOLN
INTRAMUSCULAR | Status: AC
  Filled 2014-11-07: qty 1

## 2014-11-07 MED ORDER — PHENYLEPHRINE 40 MCG/ML (10ML) SYRINGE FOR IV PUSH (FOR BLOOD PRESSURE SUPPORT)
PREFILLED_SYRINGE | INTRAVENOUS | Status: AC
  Filled 2014-11-07: qty 10

## 2014-11-07 MED ORDER — HYDROMORPHONE HCL 1 MG/ML IJ SOLN: 0.2500 mg | INTRAMUSCULAR | Status: DC | PRN

## 2014-11-07 MED ORDER — OXYCODONE HCL 5 MG PO TABS
5.0000 mg | ORAL_TABLET | ORAL | Status: DC | PRN
  Administered 2014-11-07: 5 mg via ORAL
  Administered 2014-11-08 (×4): 10 mg via ORAL
  Filled 2014-11-07 (×2): qty 2
  Filled 2014-11-07: qty 1
  Filled 2014-11-07 (×2): qty 2

## 2014-11-07 MED ORDER — METHOCARBAMOL 500 MG PO TABS
500.0000 mg | ORAL_TABLET | Freq: Four times a day (QID) | ORAL | Status: DC | PRN
  Administered 2014-11-07 – 2014-11-08 (×3): 500 mg via ORAL
  Filled 2014-11-07 (×3): qty 1

## 2014-11-07 MED ORDER — BUPIVACAINE LIPOSOME 1.3 % IJ SUSP
20.0000 mL | Freq: Once | INTRAMUSCULAR | Status: DC
  Filled 2014-11-07: qty 20

## 2014-11-07 MED ORDER — DEXAMETHASONE SODIUM PHOSPHATE 10 MG/ML IJ SOLN
10.0000 mg | Freq: Once | INTRAMUSCULAR | Status: AC
  Administered 2014-11-08: 10 mg via INTRAVENOUS
  Filled 2014-11-07: qty 1

## 2014-11-07 MED ORDER — ACETAMINOPHEN 325 MG PO TABS: 650.0000 mg | ORAL_TABLET | Freq: Four times a day (QID) | ORAL | Status: DC | PRN

## 2014-11-07 MED ORDER — RIVAROXABAN 10 MG PO TABS
10.0000 mg | ORAL_TABLET | Freq: Every day | ORAL | Status: DC
  Administered 2014-11-08: 10 mg via ORAL
  Filled 2014-11-07 (×2): qty 1

## 2014-11-07 MED ORDER — CHLORHEXIDINE GLUCONATE 4 % EX LIQD: 60.0000 mL | Freq: Once | CUTANEOUS | Status: DC

## 2014-11-07 MED ORDER — LIDOCAINE HCL (CARDIAC) 20 MG/ML IV SOLN
INTRAVENOUS | Status: DC | PRN
  Administered 2014-11-07: 20 mg via INTRAVENOUS

## 2014-11-07 MED ORDER — LIDOCAINE HCL (CARDIAC) 20 MG/ML IV SOLN
INTRAVENOUS | Status: AC
  Filled 2014-11-07: qty 5

## 2014-11-07 MED ORDER — PROPOFOL 10 MG/ML IV BOLUS
INTRAVENOUS | Status: AC
  Filled 2014-11-07: qty 20

## 2014-11-07 MED ORDER — METHYLPHENIDATE HCL 10 MG PO TABS: 10.0000 mg | ORAL_TABLET | Freq: Three times a day (TID) | ORAL | Status: DC

## 2014-11-07 MED ORDER — DOCUSATE SODIUM 100 MG PO CAPS
100.0000 mg | ORAL_CAPSULE | Freq: Two times a day (BID) | ORAL | Status: DC
  Administered 2014-11-07 – 2014-11-08 (×2): 100 mg via ORAL

## 2014-11-07 MED ORDER — ONDANSETRON HCL 4 MG PO TABS: 4.0000 mg | ORAL_TABLET | Freq: Four times a day (QID) | ORAL | Status: DC | PRN

## 2014-11-07 MED ORDER — BUPIVACAINE HCL (PF) 0.25 % IJ SOLN
INTRAMUSCULAR | Status: DC | PRN
  Administered 2014-11-07: 20 mL

## 2014-11-07 MED ORDER — METOCLOPRAMIDE HCL 10 MG PO TABS: 5.0000 mg | ORAL_TABLET | Freq: Three times a day (TID) | ORAL | Status: DC | PRN

## 2014-11-07 MED ORDER — CEFAZOLIN SODIUM-DEXTROSE 2-3 GM-% IV SOLR
2.0000 g | INTRAVENOUS | Status: AC
  Administered 2014-11-07: 2 g via INTRAVENOUS

## 2014-11-07 MED ORDER — BUPIVACAINE HCL (PF) 0.25 % IJ SOLN
INTRAMUSCULAR | Status: AC
  Filled 2014-11-07: qty 30

## 2014-11-07 MED ORDER — BUPIVACAINE LIPOSOME 1.3 % IJ SUSP
INTRAMUSCULAR | Status: DC | PRN
  Administered 2014-11-07: 20 mL

## 2014-11-07 MED ORDER — ALBUTEROL SULFATE (2.5 MG/3ML) 0.083% IN NEBU: 3.0000 mL | INHALATION_SOLUTION | Freq: Four times a day (QID) | RESPIRATORY_TRACT | Status: DC | PRN

## 2014-11-07 MED ORDER — MENTHOL 3 MG MT LOZG
1.0000 | LOZENGE | OROMUCOSAL | Status: DC | PRN
  Filled 2014-11-07: qty 9

## 2014-11-07 MED ORDER — PHENOL 1.4 % MT LIQD
1.0000 | OROMUCOSAL | Status: DC | PRN
  Filled 2014-11-07: qty 177

## 2014-11-07 MED ORDER — FLEET ENEMA 7-19 GM/118ML RE ENEM: 1.0000 | ENEMA | Freq: Once | RECTAL | Status: AC | PRN

## 2014-11-07 MED ORDER — LACTATED RINGERS IV SOLN
INTRAVENOUS | Status: DC | PRN
  Administered 2014-11-07 (×2): via INTRAVENOUS

## 2014-11-07 MED ORDER — MORPHINE SULFATE 2 MG/ML IJ SOLN
1.0000 mg | INTRAMUSCULAR | Status: DC | PRN
  Administered 2014-11-07: 2 mg via INTRAVENOUS
  Filled 2014-11-07: qty 1

## 2014-11-07 MED ORDER — METHOCARBAMOL 1000 MG/10ML IJ SOLN
500.0000 mg | Freq: Four times a day (QID) | INTRAVENOUS | Status: DC | PRN
  Filled 2014-11-07: qty 5

## 2014-11-07 MED ORDER — BISACODYL 10 MG RE SUPP: 10.0000 mg | Freq: Every day | RECTAL | Status: DC | PRN

## 2014-11-07 MED ORDER — DEXTROSE-NACL 5-0.9 % IV SOLN
INTRAVENOUS | Status: DC
  Administered 2014-11-07: 17:00:00 via INTRAVENOUS

## 2014-11-07 MED ORDER — ONDANSETRON HCL 4 MG/2ML IJ SOLN
INTRAMUSCULAR | Status: AC
  Filled 2014-11-07: qty 2

## 2014-11-07 MED ORDER — CEFAZOLIN SODIUM-DEXTROSE 2-3 GM-% IV SOLR
2.0000 g | Freq: Four times a day (QID) | INTRAVENOUS | Status: AC
  Administered 2014-11-07 – 2014-11-08 (×2): 2 g via INTRAVENOUS
  Filled 2014-11-07 (×2): qty 50

## 2014-11-07 MED ORDER — KETOROLAC TROMETHAMINE 15 MG/ML IJ SOLN: 7.5000 mg | Freq: Four times a day (QID) | INTRAMUSCULAR | Status: AC | PRN

## 2014-11-07 MED ORDER — SODIUM CHLORIDE 0.9 % IJ SOLN
INTRAMUSCULAR | Status: DC | PRN
  Administered 2014-11-07: 30 mL

## 2014-11-07 MED ORDER — PROMETHAZINE HCL 25 MG/ML IJ SOLN: 6.2500 mg | INTRAMUSCULAR | Status: DC | PRN

## 2014-11-07 MED ORDER — MIDAZOLAM HCL 5 MG/5ML IJ SOLN
INTRAMUSCULAR | Status: DC | PRN
  Administered 2014-11-07: 2 mg via INTRAVENOUS

## 2014-11-07 MED ORDER — POLYETHYLENE GLYCOL 3350 17 G PO PACK
17.0000 g | PACK | Freq: Every day | ORAL | Status: DC | PRN
  Administered 2014-11-07: 17 g via ORAL
  Filled 2014-11-07: qty 1

## 2014-11-07 MED ORDER — ACETAMINOPHEN 500 MG PO TABS
1000.0000 mg | ORAL_TABLET | Freq: Four times a day (QID) | ORAL | Status: DC
  Administered 2014-11-07 – 2014-11-08 (×3): 1000 mg via ORAL
  Filled 2014-11-07 (×4): qty 2

## 2014-11-07 MED ORDER — ACETAMINOPHEN 10 MG/ML IV SOLN
1000.0000 mg | Freq: Once | INTRAVENOUS | Status: AC
  Administered 2014-11-07: 1000 mg via INTRAVENOUS
  Filled 2014-11-07: qty 100

## 2014-11-07 MED ORDER — SODIUM CHLORIDE 0.9 % IJ SOLN
INTRAMUSCULAR | Status: AC
  Filled 2014-11-07: qty 50

## 2014-11-07 MED ORDER — METOCLOPRAMIDE HCL 5 MG/ML IJ SOLN: 5.0000 mg | Freq: Three times a day (TID) | INTRAMUSCULAR | Status: DC | PRN

## 2014-11-07 MED ORDER — DIPHENHYDRAMINE HCL 12.5 MG/5ML PO ELIX: 12.5000 mg | ORAL_SOLUTION | ORAL | Status: DC | PRN

## 2014-11-07 SURGICAL SUPPLY — 62 items
BAG SPEC THK2 15X12 ZIP CLS (MISCELLANEOUS)
BAG ZIPLOCK 12X15 (MISCELLANEOUS) IMPLANT
BANDAGE ELASTIC 6 VELCRO ST LF (GAUZE/BANDAGES/DRESSINGS) ×3 IMPLANT
BANDAGE ESMARK 6X9 LF (GAUZE/BANDAGES/DRESSINGS) ×1 IMPLANT
BLADE SAW RECIPROCATING 77.5 (BLADE) ×3 IMPLANT
BLADE SAW SGTL 13.0X1.19X90.0M (BLADE) ×3 IMPLANT
BNDG CMPR 9X6 STRL LF SNTH (GAUZE/BANDAGES/DRESSINGS) ×1
BNDG ESMARK 6X9 LF (GAUZE/BANDAGES/DRESSINGS) ×3
BOWL SMART MIX CTS (DISPOSABLE) ×3 IMPLANT
BUR OVAL CARBIDE 4.0 (BURR) ×3 IMPLANT
CAPT KNEE PARTIAL 2 ×3 IMPLANT
CEMENT HV SMART SET (Cement) ×3 IMPLANT
CLOSURE WOUND 1/2 X4 (GAUZE/BANDAGES/DRESSINGS) ×1
CUFF TOURN SGL QUICK 34 (TOURNIQUET CUFF) ×3
CUFF TRNQT CYL 34X4X40X1 (TOURNIQUET CUFF) ×1 IMPLANT
DRAPE EXTREMITY TIBURON (DRAPES) ×3 IMPLANT
DRAPE POUCH INSTRU U-SHP 10X18 (DRAPES) ×3 IMPLANT
DRSG ADAPTIC 3X8 NADH LF (GAUZE/BANDAGES/DRESSINGS) ×3 IMPLANT
DRSG PAD ABDOMINAL 8X10 ST (GAUZE/BANDAGES/DRESSINGS) ×3 IMPLANT
DURAPREP 26ML APPLICATOR (WOUND CARE) ×3 IMPLANT
ELECT REM PT RETURN 9FT ADLT (ELECTROSURGICAL) ×3
ELECTRODE REM PT RTRN 9FT ADLT (ELECTROSURGICAL) ×1 IMPLANT
EVACUATOR 1/8 PVC DRAIN (DRAIN) ×3 IMPLANT
FACESHIELD WRAPAROUND (MASK) ×18 IMPLANT
FACESHIELD WRAPAROUND OR TEAM (MASK) ×5 IMPLANT
GAUZE SPONGE 4X4 12PLY STRL (GAUZE/BANDAGES/DRESSINGS) ×3 IMPLANT
GLOVE BIO SURGEON STRL SZ7.5 (GLOVE) ×3 IMPLANT
GLOVE BIO SURGEON STRL SZ8 (GLOVE) ×5 IMPLANT
GLOVE BIOGEL PI IND STRL 6.5 (GLOVE) IMPLANT
GLOVE BIOGEL PI IND STRL 7.0 (GLOVE) IMPLANT
GLOVE BIOGEL PI IND STRL 8 (GLOVE) ×2 IMPLANT
GLOVE BIOGEL PI INDICATOR 6.5 (GLOVE) ×2
GLOVE BIOGEL PI INDICATOR 7.0 (GLOVE) ×4
GLOVE BIOGEL PI INDICATOR 8 (GLOVE) ×6
GLOVE ECLIPSE 6.5 STRL STRAW (GLOVE) ×2 IMPLANT
GOWN SRG XL XLNG 56XLVL 4 (GOWN DISPOSABLE) IMPLANT
GOWN STRL NON-REIN XL XLG LVL4 (GOWN DISPOSABLE) ×3
GOWN STRL REUS W/TWL LRG LVL3 (GOWN DISPOSABLE) ×7 IMPLANT
GOWN STRL REUS W/TWL XL LVL3 (GOWN DISPOSABLE) ×3 IMPLANT
HANDPIECE INTERPULSE COAX TIP (DISPOSABLE) ×3
IMMOBILIZER KNEE 20 (SOFTGOODS) ×3
IMMOBILIZER KNEE 20 THIGH 36 (SOFTGOODS) ×1 IMPLANT
KIT BASIN OR (CUSTOM PROCEDURE TRAY) ×3 IMPLANT
KIT IMPL STRL TIB IPOLY IUNI IMPLANT
MANIFOLD NEPTUNE II (INSTRUMENTS) ×3 IMPLANT
NDL SAFETY ECLIPSE 18X1.5 (NEEDLE) ×2 IMPLANT
NEEDLE HYPO 18GX1.5 SHARP (NEEDLE) ×6
PACK TOTAL JOINT (CUSTOM PROCEDURE TRAY) ×3 IMPLANT
PADDING CAST COTTON 6X4 STRL (CAST SUPPLIES) ×4 IMPLANT
POSITIONER SURGICAL ARM (MISCELLANEOUS) ×3 IMPLANT
SET HNDPC FAN SPRY TIP SCT (DISPOSABLE) ×1 IMPLANT
STRIP CLOSURE SKIN 1/2X4 (GAUZE/BANDAGES/DRESSINGS) ×3 IMPLANT
SUCTION FRAZIER 12FR DISP (SUCTIONS) ×3 IMPLANT
SUT MNCRL AB 4-0 PS2 18 (SUTURE) ×3 IMPLANT
SUT VIC AB 2-0 CT1 27 (SUTURE) ×6
SUT VIC AB 2-0 CT1 TAPERPNT 27 (SUTURE) ×2 IMPLANT
SUT VLOC 180 0 24IN GS25 (SUTURE) ×3 IMPLANT
SYR 20CC LL (SYRINGE) ×3 IMPLANT
SYR 50ML LL SCALE MARK (SYRINGE) ×3 IMPLANT
TOWEL OR 17X26 10 PK STRL BLUE (TOWEL DISPOSABLE) ×3 IMPLANT
TRAY FOLEY CATH 14FRSI W/METER (CATHETERS) ×3 IMPLANT
WRAP KNEE MAXI GEL POST OP (GAUZE/BANDAGES/DRESSINGS) ×2 IMPLANT

## 2014-11-07 NOTE — Transfer of Care (Signed)
Immediate Anesthesia Transfer of Care Note  Patient: Nichole [name]  Procedure(s) Performed: Procedure(s) (LRB): UNICOMPARTMENTAL LEFT KNEE REPLACEMENT (Left)  Patient Location: PACU  Anesthesia Type: Spinal  Level of Consciousness: sedated, patient cooperative and responds to stimulation  Airway & Oxygen Therapy: Patient Spontanous Breathing and Patient connected to face mask oxgen  Post-op Assessment: Report given to PACU RN and Post -op Vital signs reviewed and stable  Post vital signs: Reviewed and stable  Complications: [name] apparent anesthesia complications

## 2014-11-07 NOTE — Anesthesia Postprocedure Evaluation (Signed)
  Anesthesia Post-op Note  Patient: Nichole [name]  Procedure(s) Performed: Procedure(s) (LRB): UNICOMPARTMENTAL LEFT KNEE REPLACEMENT (Left)  Patient Location: PACU  Anesthesia Type: Spinal  Level of Consciousness: awake and alert   Airway and Oxygen Therapy: Patient Spontanous Breathing  Post-op Pain: mild  Post-op Assessment: Post-op Vital signs reviewed, Patient's Cardiovascular Status Stable, Respiratory Function Stable, Patent Airway and No signs of Nausea or vomiting  Last Vitals:  Filed Vitals:   11/07/14 1515  BP:   Pulse:   Temp: 36.4 C  Resp:     Post-op Vital Signs: stable   Complications: No apparent anesthesia complications

## 2014-11-07 NOTE — Anesthesia Procedure Notes (Signed)
Spinal Patient location during procedure: OR Start time: 11/07/2014 12:43 PM End time: 11/07/2014 12:47 PM Staffing Anesthesiologist: ROSE, Iona Beard Resident/CRNA: Lajuana Carry E Performed by: resident/CRNA  Preanesthetic Checklist Completed: patient identified, site marked, surgical consent, pre-op evaluation, timeout performed, IV checked, risks and benefits discussed and monitors and equipment checked Spinal Block Patient position: sitting Prep: Betadine Patient monitoring: heart rate, continuous pulse ox and blood pressure Approach: midline Location: L3-4 Injection technique: single-shot Needle Needle type: Sprotte  Needle gauge: 24 G Needle length: 10 cm Assessment Sensory level: T6 Additional Notes Expiration date of kit checked and confirmed. Time out performed. Clear CSF pre/post injection.Patient tolerated procedure well, without complications.

## 2014-11-07 NOTE — H&P (View-Only) (Signed)
Nichole [name] DOB: 11-20-1951 Married / Language: English / Race: White Female Date of Admission:  11/07/2014 CC: Left Knee Pain History of Present Illness The patient is a 63 year old female who comes in for a preoperative History and Physical. The patient is scheduled for a left unicompartmental replacement to be performed by Dr. Dione Plover. Aluisio, MD at [facility] on 11/07/2014. The patient is a 63 year old female who presents for follow up of their knee. The patient is being followed for their left knee pain and osteoarthritis. They are several months out from Synvisc series. Symptoms reported today include: pain, swelling, instability and pain with weightbearing. The patient feels that they are doing poorly. The following medication has been used for pain control: antiinflammatory medication (Advil). The patient has not gotten any relief of their symptoms with viscosupplementation. The patient indicates that they have questions or concerns today regarding their progress at this point. [name] did not have any response to the visco supplements. She now has had cortisone and visco supplements. She is having pain at all times. The pain is all along the medial aspect of her knee. She is not having any instability symptoms. She is not getting any significant swelling. She was informed that she is a perfect candidate for this operation, given her excellent ROM, excellent stability and isolated medial compartment disease. I told her that it should allow for ability to do more functional activities than a total knee. We had a long discussion about this, and she's more comfortable going ahead and doing the unicompartmental replacement. We will proceed with surgery as planned. They have been treated conservatively in the past for the above stated problem and despite conservative measures, they continue to have progressive pain and severe functional limitations and dysfunction. They have failed  non-operative management including home exercise, medications, and injections. It is felt that they would benefit from undergoing total joint replacement. Risks and benefits of the procedure have been discussed with the patient and they elect to proceed with surgery. There are no active contraindications to surgery such as ongoing infection or rapidly progressive neurological disease.  Allergies  Sulfa Drugs Hives, Swelling.  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 child Cancer Brother, Father, Maternal Grandmother, Mother. First Degree Relatives reported Osteoarthritis Brother, Father, Maternal Grandmother, Mother, Sister. Osteoporosis Father. Chronic Obstructive Lung Disease Father. Depression Brother, Mother, Sister. Drug / Alcohol Addiction Brother.  Social History Exercise Exercises daily; does individual sport and gym / weights Living situation live with spouse Marital status married Not under pain contract Never consumed alcohol 05/17/2014: Never consumed alcohol Number of flights of stairs before winded 2-3 Children 2 Tobacco use Never smoker. 05/17/2014 Current work status working part time No history of drug/alcohol rehab  Medication History  Advil (Oral) Specific dose unknown - Active.  Past Surgical History  Colon Polyp Removal - Colonoscopy Tonsillectomy Date: 55.  Review of Systems General Not Present- Chills, Fatigue, Fever, Memory Loss, Night Sweats, Weight Gain and Weight Loss. Skin Not Present- Eczema, Hives, Itching, Lesions and Rash. HEENT Not Present- Dentures, Double Vision, Headache, Hearing Loss, Tinnitus  and Visual Loss. Respiratory Not Present- Allergies, Chronic Cough, Coughing up blood, Shortness of breath at rest and Shortness of breath with exertion. Cardiovascular Not Present- Chest  Pain, Difficulty Breathing Lying Down, Murmur, Palpitations, Racing/skipping heartbeats and Swelling. Gastrointestinal Not Present- Abdominal Pain, Bloody Stool, Constipation, Diarrhea, Difficulty Swallowing, Heartburn, Jaundice, Loss of appetitie, Nausea and Vomiting. Female Genitourinary Not Present- Blood in Urine, Discharge, Flank Pain, Incontinence, Painful Urination, Urgency, Urinary frequency, Urinary Retention, Urinating at Night and Weak urinary stream. Musculoskeletal Present- Morning Stiffness. Not Present- Back Pain, Joint Pain, Joint Swelling, Muscle Pain, Muscle Weakness and Spasms. Neurological Not Present- Blackout spells, Difficulty with balance, Dizziness, Paralysis, Tremor and Weakness. Psychiatric Not Present- Insomnia.  Vitals Weight: 139 lb Height: 66in Weight was reported by patient. Height was reported by patient. Body Surface Area: 1.71 m Body Mass Index: 22.43 kg/m  Pulse: 68 (Regular)  BP: 116/70 (Sitting, Right Arm, Standard)  Physical Exam  General Mental Status -Alert, cooperative and good historian. General Appearance-pleasant, Not in acute distress. Orientation-Oriented X3. [place], Well nourished and Well developed.  Head and Neck Head-normocephalic, atraumatic . Neck Global Assessment - supple, no bruit auscultated on the right, no bruit auscultated on the left.  Eye Vision-Wears corrective lenses. Pupil - Bilateral-Regular and Round. Motion - Bilateral-EOMI.  Chest and Lung Exam Auscultation Breath sounds - clear at anterior chest wall and clear at posterior chest wall. Adventitious sounds - No Adventitious sounds.  Cardiovascular Auscultation Rhythm - Regular rate and rhythm. Heart Sounds - S1 WNL and S2 WNL.  Murmurs & Other Heart Sounds - Auscultation of the heart reveals - No Murmurs.  Abdomen Palpation/Percussion Tenderness - Abdomen is non-tender to palpation. Rigidity (guarding) - Abdomen is soft. Auscultation Auscultation of the abdomen reveals - Bowel sounds normal.  Female Genitourinary Note: Not done, not pertinent to present illness   Musculoskeletal Note: On exam, well-developed female, in no distress. Her left knee shows a tiny effusion. Her ROM is 0-130 degrees. She has significant medial tenderness. There is no lateral tenderness or instability noted.  RADIOGRAPHS: Her radiographs are reviewed from 2 weeks ago, and she is essentially bone on bone in the medial compartment with a normal lateral compartment and normal patellofemoral compartment.  Assessment & Plan Primary localized osteoarthritis of left knee (M17.12) Note:Plan is for a Left Unicompartment Knee Replacement by Dr. Wynelle Link.  Plan is to go home.  PCP - Dr. Leighton Ruff  The patient does not have any contraindications and will receive TXA (tranexamic acid) prior to surgery.  Signed electronically by Joelene Millin, III PA-C

## 2014-11-07 NOTE — Interval H&P Note (Signed)
History and Physical Interval Note:  11/07/2014 11:47 AM  Nichole [name]  has presented today for surgery, with the diagnosis of left knee osteoarthritis medial compartment  The various methods of treatment have been discussed with the patient and family. After consideration of risks, benefits and other options for treatment, the patient has consented to  Procedure(s): UNICOMPARTMENTAL LEFT KNEE REPLACEMENT (Left) as a surgical intervention .  The patient's history has been reviewed, patient examined, no change in status, stable for surgery.  I have reviewed the patient's chart and labs.  Questions were answered to the patient's satisfaction.     Gearlean Alf

## 2014-11-07 NOTE — Anesthesia Preprocedure Evaluation (Signed)
Anesthesia Evaluation  Patient identified by MRN, date of birth, ID band Patient awake    Reviewed: Allergy & Precautions, H&P , NPO status , Patient's Chart, lab work & pertinent test results  Airway Mallampati: II  TM Distance: >3 FB Neck ROM: Full    Dental no notable dental hx.    Pulmonary neg pulmonary ROS,  breath sounds clear to auscultation  Pulmonary exam normal       Cardiovascular negative cardio ROS  Rhythm:Regular Rate:Normal     Neuro/Psych negative neurological ROS  negative psych ROS   GI/Hepatic negative GI ROS, Neg liver ROS,   Endo/Other  negative endocrine ROS  Renal/GU negative Renal ROS  negative genitourinary   Musculoskeletal negative musculoskeletal ROS (+)   Abdominal   Peds negative pediatric ROS (+)  Hematology negative hematology ROS (+)   Anesthesia Other Findings   Reproductive/Obstetrics negative OB ROS                             Anesthesia Physical Anesthesia Plan  ASA: I  Anesthesia Plan: Spinal   Post-op Pain Management:    Induction: Intravenous  Airway Management Planned: Simple Face Mask  Additional Equipment:   Intra-op Plan:   Post-operative Plan:   Informed Consent: I have reviewed the patients History and Physical, chart, labs and discussed the procedure including the risks, benefits and alternatives for the proposed anesthesia with the patient or authorized representative who has indicated his/her understanding and acceptance.   Dental advisory given  Plan Discussed with: CRNA and Surgeon  Anesthesia Plan Comments:         Anesthesia Quick Evaluation

## 2014-11-07 NOTE — Plan of Care (Signed)
Problem: Phase I Progression Outcomes Goal: CMS/Neurovascular status WDL Outcome: Completed/Met Date Met:  11/07/14 Goal: Pain controlled with appropriate interventions Outcome: Completed/Met Date Met:  11/07/14 Goal: Dangle or out of bed evening of surgery Outcome: Completed/Met Date Met:  11/07/14 Goal: Initial discharge plan identified Outcome: Completed/Met Date Met:  11/07/14 Goal: Hemodynamically stable Outcome: Completed/Met Date Met:  11/07/14

## 2014-11-07 NOTE — Op Note (Signed)
OPERATIVE REPORT  PREOPERATIVE DIAGNOSIS: Medial compartment osteoarthritis, Left knee  POSTOPERATIVE DIAGNOSIS: Medial compartment osteoarthritis, Left knee  PROCEDURE:Left knee medial unicompartmental arthroplasty.   SURGEON: Gaynelle Arabian, MD   ASSISTANT: Arlee Muslim, PA-C  ANESTHESIA:  Spinal.   ESTIMATED BLOOD LOSS: Minimal.   DRAINS: Hemovac x1.   TOURNIQUET TIME: 32 minutes at 300 mmHg.   COMPLICATIONS: None.   CONDITION: Stable to recovery.   BRIEF CLINICAL NOTE:Nichole [name] is a 63 y.o. female, who has  significant isolated medial compartment arthritis of the Left knee. She has had nonoperative management including injections. She has had  cortisone and viscous supplements. Unfortunately, the pain persists.  Radiograph showed isolated medial compartment bone-on-bone arthritis  with normal-appearing patellofemoral and lateral compartments. She  presents now for left knee unicompartmental arthroplasty.   PROCEDURE IN DETAIL: After successful administration of  Spinal anesthetic, a tourniquet was placed high on the  Left thigh and left lower extremity prepped and draped in usual sterile fashion. Extremity was wrapped in an Esmarch, knee flexed, and tourniquet inflated to 300 mmHg. A midline incision was made with a 10 blade through subcutaneous  tissue to the extensor mechanism. A fresh blade was used to make a  medial parapatellar arthrotomy. Soft tissue on the proximal medial  tibia subperiosteally elevated to the joint line with a knife and into  the semimembranosus bursa with a Cobb elevator. The patella was  subluxed laterally, and the knee flexed 90 degrees. The ACL was intact.  The marginal osteophytes on the medial femur and tibia were removed with  a rongeur. The medial meniscus was also removed. The femoral cutting  block where the conformis unicompartmental knee system was placed along  the femur. There was excellent fit. I traced the  outline. We then  removed any remaining cartilage within this outline. We then placed the  cutting block again and pinned in position. The posterior femoral cut  was made, it was approximately 5 mm. The lug holes for the femoral  component were then drilled through the cutting block. The cutting  block was subsequently removed. We then utilized the high speed burr to  create a small trough at the superior aspect of the components that of  the inset and would not overhang the cartilage. The trial was placed,  it had excellent fit. The trial was subsequently removed.       The trial was placed again and the B chip was placed. There was  excellent balance throughout full motion. Also with excellent fit on  her tibia. This was removed as was the femoral trial. A curette was  used to remove any remaining cartilage from the tibia. The tibial  cutting block was then placed and there was a perfect fit on the tibial  surface. The appropriate slope was placed and it was pinned in  position. The reciprocating saw was used to make the central cut and  then the oscillating saw used to make the horizontal cut. The bone  fragment was then removed. The tibial trial was placed and had perfect  fit on the tibia. We then drilled the 2 lug holes and did the keel punch.  We then placed tibia trial femur, and a 6 mm trial insert. There was  excellent stability throughout full range of motion and no impingement.  The trial was then removed. We drilled small holes in the distal  femur in order to create more conduits for the cement. The cut bone  surfaces were  thoroughly irrigated with pulsatile lavage while the  cement was mixed on the back table. We then cemented the tibial  component into place, impacted it and removed the extruded cement. The  same was done for the femoral component. Trial 6-mm inserts placed,  knee held in full extension, and all extruded cement removed. While the  cement was hardening, I  injected the extensor mechanism, periosteum of  the femur and subcu tissues, a total of 20 mL of Exparel mixed with 30  mL of saline and then did an additional injection of 20 mL of 0.25%  Marcaine into the same tissues. When the cement had fully hardened,  then the permanent polyethylene was placed in tibial tray. There was  excellent stability throughout full range of motion with no lift off the  component and no evidence of any impingement. Wound was copiously  irrigated with saline solution, and the arthrotomy closed over a Hemovac  drain with a running #1 V-Loc suture. The subcutaneous was closed with  interrupted 2-0 Vicryl and subcuticular running 4-0 Monocryl. The drain  was hooked to suction. Incision cleaned and dried and Steri-Strips and  a bulky sterile dressing applied. The tourniquet was released after a  total time of 32 minutes. This was done after closing the extensor  mechanism. The wound was closed and a bulky sterile dressing was  applied. She was placed into a knee immobilizer, awakened and  transported to recovery room in stable condition.  Please note that a surgical assistant was a medical necessity for this  procedure in order to perform it in a safe and expeditious manner.  Assistance was necessary for retracting vital ligaments, neurovascular  structures, as well as for proper positioning of the limb to allow for  appropriate bone cuts and appropriate placement of the prosthesis.    [name], MD

## 2014-11-08 LAB — CBC
HCT: 35.8 % — ABNORMAL LOW (ref 36.0–46.0)
Hemoglobin: 12.5 g/dL (ref 12.0–15.0)
MCH: 30.9 pg (ref 26.0–34.0)
MCHC: 34.9 g/dL (ref 30.0–36.0)
MCV: 88.6 fL (ref 78.0–100.0)
PLATELETS: 232 10*3/uL (ref 150–400)
RBC: 4.04 MIL/uL (ref 3.87–5.11)
RDW: 12.7 % (ref 11.5–15.5)
WBC: 10.5 10*3/uL (ref 4.0–10.5)

## 2014-11-08 LAB — BASIC METABOLIC PANEL
Anion gap: 11 (ref 5–15)
BUN: 15 mg/dL (ref 6–23)
CALCIUM: 9.7 mg/dL (ref 8.4–10.5)
CO2: 26 meq/L (ref 19–32)
CREATININE: 0.68 mg/dL (ref 0.50–1.10)
Chloride: 101 mEq/L (ref 96–112)
GFR calc non Af Amer: 90 mL/min — ABNORMAL LOW (ref >90)
Glucose, Bld: 167 mg/dL — ABNORMAL HIGH (ref 70–99)
Potassium: 4.3 mEq/L (ref 3.7–5.3)
Sodium: 138 mEq/L (ref 137–147)

## 2014-11-08 MED ORDER — TRAMADOL HCL 50 MG PO TABS: 50.0000 mg | ORAL_TABLET | Freq: Four times a day (QID) | ORAL | Status: DC | PRN

## 2014-11-08 MED ORDER — METHOCARBAMOL 500 MG PO TABS: 500.0000 mg | ORAL_TABLET | Freq: Four times a day (QID) | ORAL | Status: DC | PRN

## 2014-11-08 MED ORDER — RIVAROXABAN 10 MG PO TABS: 10.0000 mg | ORAL_TABLET | Freq: Every day | ORAL | Status: DC

## 2014-11-08 MED ORDER — OXYCODONE HCL 5 MG PO TABS: 5.0000 mg | ORAL_TABLET | ORAL | Status: DC | PRN

## 2014-11-08 NOTE — Progress Notes (Signed)
Physical Therapy Treatment Patient Details Name: Nichole [name] MRN: [id number] DOB: 1951-11-10 Today's Date: 11/08/2014    History of Present Illness L UKR    PT Comments    Patient practiced steps and did well, ready for DC,.  Follow Up Recommendations  Home health PT;Supervision/Assistance - 24 hour     Equipment Recommendations       Recommendations for Other Services       Precautions / Restrictions Precautions Precautions: Knee;Fall Required Braces or Orthoses: Knee Immobilizer - Left    Mobility  Bed Mobility Overal bed mobility: Needs Assistance Bed Mobility: Supine to Sit     Supine to sit: Min assist     General bed mobility comments: assist with LLE  Transfers Overall transfer level: Needs assistance Equipment used: Rolling walker (2 wheeled) Transfers: Sit to/from Stand Sit to Stand: Min guard         General transfer comment: cues for hand  and LLE  Ambulation/Gait Ambulation/Gait assistance: Supervision Ambulation Distance (Feet): 300 Feet Assistive device: Rolling walker (2 wheeled) Gait Pattern/deviations: Step-through pattern     General Gait Details: cues for sequence   Stairs Stairs: Yes Stairs assistance: Min assist Stair Management: One rail Left;One rail Right;Step to pattern;Forwards;With crutches Number of Stairs: 4 General stair comments: pt tolerated  steps well, spouse instructed. Pt to rest on first level upon arrival at home before going to second level.  Wheelchair Mobility    Modified Rankin (Stroke Patients Only)       Balance                                    Cognition Arousal/Alertness: Awake/alert Behavior During Therapy: WFL for tasks assessed/performed Overall Cognitive Status: Within Functional Limits for tasks assessed                      Exercises Total Joint Exercises Quad Sets: AROM;Both;10 reps Heel Slides: AAROM;Left;10 reps;Supine Hip ABduction/ADduction:  AAROM;Left;10 reps;Supine Straight Leg Raises: AAROM;Left;10 reps;Supine Goniometric ROM: 10-60    General Comments        Pertinent Vitals/Pain Pain Assessment: 0-10 Pain Score: 3  Pain Location: L knee Pain Descriptors / Indicators: Aching;Sore Pain Intervention(s): Monitored during session    Home Living Family/patient expects to be discharged to:: Private residence Living Arrangements: Spouse/significant other Available Help at Discharge: Family Type of Home: House Home Access: Stairs to enter Entrance Stairs-Rails: None Home Layout: Two level;Full bath on main level Home Equipment: Walker - 2 wheels      Prior Function            PT Goals (current goals can now be found in the care plan section) Acute Rehab PT Goals Patient Stated Goal: get back to my normal activity PT Goal Formulation: With patient/family Time For Goal Achievement: 11/10/14 Potential to Achieve Goals: Good Progress towards PT goals: Progressing toward goals    Frequency  7X/week    PT Plan Current plan remains appropriate    Co-evaluation             End of Session Equipment Utilized During Treatment: Left knee immobilizer Activity Tolerance: Patient tolerated treatment well Patient left: in chair;with family/visitor present     Time: 0263-7858 PT Time Calculation (min) (ACUTE ONLY): 31 min  Charges:  $Gait Training: 23-37 mins $Therapeutic Exercise: 8-22 mins $Self Care/Home Management: 8-22  G Codes:      Claretha Cooper 11/08/2014, 2:54 PM

## 2014-11-08 NOTE — Evaluation (Addendum)
Physical Therapy Evaluation Patient Details Name: Nichole [name] MRN: [id number] DOB: December 18, 1951 Today's Date: 11/08/2014   History of Present Illness  L UKR  Clinical Impression  Patient tolerated very well. Plans for DC today after PT. Patient will benefit from PT to address problems listed in note below.   Follow Up Recommendations Home health PT;Supervision/Assistance - 24 hour    Equipment Recommendations       Recommendations for Other Services       Precautions / Restrictions Precautions Precautions: Knee;Fall Required Braces or Orthoses: Knee Immobilizer - Left      Mobility  Bed Mobility Overal bed mobility: Needs Assistance Bed Mobility: Supine to Sit     Supine to sit: Min assist     General bed mobility comments: assist with LLE  Transfers Overall transfer level: Needs assistance Equipment used: Rolling walker (2 wheeled) Transfers: Sit to/from Stand Sit to Stand: Min assist         General transfer comment: cues for hand  and LLE  Ambulation/Gait Ambulation/Gait assistance: Min assist Ambulation Distance (Feet): 150 Feet Assistive device: Rolling walker (2 wheeled) Gait Pattern/deviations: Step-to pattern;Step-through pattern     General Gait Details: cues for sequence  Stairs            Wheelchair Mobility    Modified Rankin (Stroke Patients Only)       Balance                                             Pertinent Vitals/Pain Pain Assessment: 0-10 Pain Score: 4  Pain Location: L knee Pain Descriptors / Indicators: Aching;Sore Pain Intervention(s): Monitored during session;Premedicated before session;Repositioned;Ice applied    Home Living Family/patient expects to be discharged to:: Private residence Living Arrangements: Spouse/significant other Available Help at Discharge: Family Type of Home: House Home Access: Stairs to enter Entrance Stairs-Rails: None Entrance Stairs-Number of Steps: 2 Home  Layout: Two level;Full bath on main level Home Equipment: Walker - 2 wheels      Prior Function                 Hand Dominance        Extremity/Trunk Assessment   Upper Extremity Assessment: Overall WFL for tasks assessed           Lower Extremity Assessment: LLE deficits/detail   LLE Deficits / Details: able  to perform SLR with min assist.  Cervical / Trunk Assessment: Normal  Communication   Communication: No difficulties  Cognition Arousal/Alertness: Awake/alert Behavior During Therapy: WFL for tasks assessed/performed Overall Cognitive Status: Within Functional Limits for tasks assessed                      General Comments      Exercises Total Joint Exercises Quad Sets: AROM;Both;10 reps Heel Slides: AAROM;Left;10 reps;Supine Hip ABduction/ADduction: AAROM;Left;10 reps;Supine Straight Leg Raises: AAROM;Left;10 reps;Supine Goniometric ROM: 10-60      Assessment/Plan    PT Assessment Patient needs continued PT services  PT Diagnosis Difficulty walking;Acute pain   PT Problem List Decreased strength;Decreased range of motion;Decreased activity tolerance;Decreased mobility;Decreased knowledge of precautions;Decreased safety awareness;Decreased knowledge of use of DME;Pain  PT Treatment Interventions DME instruction;Gait training;Stair training;Functional mobility training;Therapeutic activities;Therapeutic exercise;Patient/family education   PT Goals (Current goals can be found in the Care Plan section) Acute Rehab PT Goals Patient Stated Goal: get back  to my normal activity PT Goal Formulation: With patient/family Time For Goal Achievement: 11/10/14 Potential to Achieve Goals: Good    Frequency 7X/week   Barriers to discharge        Co-evaluation               End of Session Equipment Utilized During Treatment: Left knee immobilizer Activity Tolerance: Patient tolerated treatment well Patient left: in chair;with call  bell/phone within reach;with family/visitor present Nurse Communication: Mobility status         Time: 1015-1055 PT Time Calculation (min) (ACUTE ONLY): 40 min   Charges:   PT Evaluation $Initial PT Evaluation Tier I: 1 Procedure PT Treatments $Gait Training: 8-22 mins $Therapeutic Exercise: 8-22 mins $Self Care/Home Management: 8-22   PT G Codes:          Nichole [name] 11/08/2014, 1:03 PM

## 2014-11-08 NOTE — Care Management Note (Addendum)
    Page 1 of 2   11/08/2014     2:08:07 PM CARE MANAGEMENT NOTE 11/08/2014  Patient:  ROSSANA, MOLCHAN   Account Number:  [id number]  Date Initiated:  11/08/2014  Documentation initiated by:  [facility]  Subjective/Objective Assessment:   adm: UNICOMPARTMENTAL LEFT KNEE REPLACEMENT (Left)     Action/Plan:   discharge planning   Anticipated DC Date:  11/08/2014   Anticipated DC Plan:  [place]  CM consult      [facility] Choice  HOME HEALTH   Choice offered to / List presented to:  C-1 Patient   DME arranged  NA      DME agency  NA     [place] arranged  HH-2 PT      [place].   Status of service:  Completed, signed off Medicare Important Message given?   (If response is "NO", the following Medicare IM given date fields will be blank) Date Medicare IM given:   Medicare IM given by:   Date Additional Medicare IM given:   Additional Medicare IM given by:    Discharge Disposition:  [place]  Per UR Regulation:    If discussed at Long Length of Stay Meetings, dates discussed:    Comments:  11/08/14 11:10 CM met with pt in room to offer choice of home health agency.  Pt chooses [facility] to render HHPT. Address and contact information verified with pt.  No DME is needed.  Referral called to [facility] rep, Kristen.  No other CM needs were communicated.  Mariane Masters, BSN, CM [phone].

## 2014-11-08 NOTE — Discharge Instructions (Addendum)
Dr. Gaynelle Arabian Total Joint Specialist [facility] [address], [place], [place] [address] [phone]  UNI KNEE REPLACEMENT POSTOPERATIVE DIRECTIONS   Knee Rehabilitation, Guidelines Following Surgery  Results after knee surgery are often greatly improved when you follow the exercise, range of motion and muscle strengthening exercises prescribed by your doctor. Safety measures are also important to protect the knee from further injury. Any time any of these exercises cause you to have increased pain or swelling in your knee joint, decrease the amount until you are comfortable again and slowly increase them. If you have problems or questions, call your caregiver or physical therapist for advice.   HOME CARE INSTRUCTIONS  Remove items at home which could result in a fall. This includes throw rugs or furniture in walking pathways.  Continue medications as instructed at time of discharge. You may have some home medications which will be placed on hold until you complete the course of blood thinner medication.  You may start showering once you are discharged home but do not submerge the incision under water. Just pat the incision dry and apply a dry gauze dressing on daily. Walk with walker as instructed.  You may resume a sexual relationship in one month or when given the OK by  your doctor.   Use walker as long as suggested by your caregivers.  Avoid periods of inactivity such as sitting longer than an hour when not asleep. This helps prevent blood clots.  You may put full weight on your legs and walk as much as is comfortable.  You may return to work once you are cleared by your doctor.  Do not drive a car for 6 weeks or until released by you surgeon.   Do not drive while taking narcotics.  Wear the elastic stockings for three weeks following surgery during the day but you may remove then at night. Make sure you keep all of your appointments after your  operation with all of your doctors and caregivers. You should call the office at the above phone number and make an appointment for approximately two weeks after the date of your surgery. Change the dressing daily and reapply a dry dressing each time. Please pick up a stool softener and laxative for home use as long as you are requiring pain medications.  ICE to the affected knee every three hours for 30 minutes at a time and then as needed for pain and swelling.  Continue to use ice on the knee for pain and swelling from surgery. You may notice swelling that will progress down to the foot and ankle.  This is normal after surgery.  Elevate the leg when you are not up walking on it.   It is important for you to complete the blood thinner medication as prescribed by your doctor.  Continue to use the breathing machine which will help keep your temperature down.  It is common for your temperature to cycle up and down following surgery, especially at night when you are not up moving around and exerting yourself.  The breathing machine keeps your lungs expanded and your temperature down.  RANGE OF MOTION AND STRENGTHENING EXERCISES  Rehabilitation of the knee is important following a knee injury or an operation. After just a few days of immobilization, the muscles of the thigh which control the knee become weakened and shrink (atrophy). Knee exercises are designed to build up the tone and strength of the thigh muscles and to improve knee motion.  Often times heat used for twenty to thirty minutes before working out will loosen up your tissues and help with improving the range of motion but do not use heat for the first two weeks following surgery. These exercises can be done on a training (exercise) mat, on the floor, on a table or on a bed. Use what ever works the best and is most comfortable for you Knee exercises include:  Leg Lifts - While your knee is still immobilized in a splint or cast, you can do  straight leg raises. Lift the leg to 60 degrees, hold for 3 sec, and slowly lower the leg. Repeat 10-20 times 2-3 times daily. Perform this exercise against resistance later as your knee gets better.  Quad and Hamstring Sets - Tighten up the muscle on the front of the thigh (Quad) and hold for 5-10 sec. Repeat this 10-20 times hourly. Hamstring sets are done by pushing the foot backward against an object and holding for 5-10 sec. Repeat as with quad sets.  A rehabilitation program following serious knee injuries can speed recovery and prevent re-injury in the future due to weakened muscles. Contact your doctor or a physical therapist for more information on knee rehabilitation.   SKILLED REHAB INSTRUCTIONS: If the patient is transferred to a skilled rehab facility following release from the hospital, a list of the current medications will be sent to the facility for the patient to continue.  When discharged from the skilled rehab facility, please have the facility set up the patient's [place] prior to being released. Also, the skilled facility will be responsible for providing the patient with their medications at time of release from the facility to include their pain medication, the muscle relaxants, and their blood thinner medication. If the patient is still at the rehab facility at time of the two week follow up appointment, the skilled rehab facility will also need to assist the patient in arranging follow up appointment in our office and any transportation needs.  MAKE SURE YOU:  Understand these instructions.  Will watch your condition.  Will get help right away if you are not doing well or get worse.    Pick up stool softner and laxative for home. Do not submerge incision under water. May start changing dressing on Wed 11/09/2014 and apply a dry dressing to incision daily May shower starting Thursday 11/10/2014 Continue to use ice for pain and swelling from  surgery.  Take Xarelto for a total of two weeks, then discontinue Xarelto. Once the patient has completed the Xarelto, they may resume the 81 mg Aspirin.   Postoperative Constipation Protocol  Constipation - defined medically as fewer than three stools per week and severe constipation as less than one stool per week.  One of the most common issues patients have following surgery is constipation.  Even if you have a regular bowel pattern at home, your normal regimen is likely to be disrupted due to multiple reasons following surgery.  Combination of anesthesia, postoperative narcotics, change in appetite and fluid intake all can affect your bowels.  In order to avoid complications following surgery, here are some recommendations in order to help you during your recovery period.  Colace (docusate) - Pick up an over-the-counter form of Colace or another stool softener and take twice a day as long as you are requiring postoperative pain medications.  Take with a full glass of water daily.  If you experience loose stools or diarrhea, hold the colace until  you stool forms back up.  If your symptoms do not get better within 1 week or if they get worse, check with your doctor.  Dulcolax (bisacodyl) - Pick up over-the-counter and take as directed by the product packaging as needed to assist with the movement of your bowels.  Take with a full glass of water.  Use this product as needed if not relieved by Colace only.   MiraLax (polyethylene glycol) - Pick up over-the-counter to have on hand.  MiraLax is a solution that will increase the amount of water in your bowels to assist with bowel movements.  Take as directed and can mix with a glass of water, juice, soda, coffee, or tea.  Take if you go more than two days without a movement. Do not use MiraLax more than once per day. Call your doctor if you are still constipated or irregular after using this medication for 7 days in a row.  If you continue to have  problems with postoperative constipation, please contact the office for further assistance and recommendations.  If you experience "the worst abdominal pain ever" or develop nausea or vomiting, please contact the office immediatly for further recommendations for treatment.   Information on my medicine - XARELTO (Rivaroxaban)  This medication education was reviewed with me or my healthcare representative as part of my discharge preparation.  The pharmacist that spoke with me during my hospital stay was:  Minda Ditto, [facility]  Why was Xarelto prescribed for you? Xarelto was prescribed for you to reduce the risk of blood clots forming after orthopedic surgery. The medical term for these abnormal blood clots is venous thromboembolism (VTE).  What do you need to know about xarelto ? Take your Xarelto ONCE DAILY at the same time every day. You may take it either with or without food.  If you have difficulty swallowing the tablet whole, you may crush it and mix in applesauce just prior to taking your dose.  Take Xarelto exactly as prescribed by your doctor and DO NOT stop taking Xarelto without talking to the doctor who prescribed the medication.  Stopping without other VTE prevention medication to take the place of Xarelto may increase your risk of developing a clot.  After discharge, you should have regular check-up appointments with your healthcare provider that is prescribing your Xarelto.    What do you do if you miss a dose? If you miss a dose, take it as soon as you remember on the same day then continue your regularly scheduled once daily regimen the next day. Do not take two doses of Xarelto on the same day.   Important Safety Information A possible side effect of Xarelto is bleeding. You should call your healthcare provider right away if you experience any of the following: ? Bleeding from an injury or your nose that does not stop. ? Unusual colored urine (red or dark brown) or  unusual colored stools (red or black). ? Unusual bruising for unknown reasons. ? A serious fall or if you hit your head (even if there is no bleeding).  Some medicines may interact with Xarelto and might increase your risk of bleeding while on Xarelto. To help avoid this, consult your healthcare provider or pharmacist prior to using any new prescription or non-prescription medications, including herbals, vitamins, non-steroidal anti-inflammatory drugs (NSAIDs) and supplements.  This website has more information on Xarelto: [email or web address]  Recommend holding ASA, Ibuprofen, Fish Oil until Xarelto completed

## 2014-11-08 NOTE — Discharge Summary (Signed)
Physician Discharge Summary   Patient ID: Nichole [name] MRN: [id number] DOB/AGE: May 06, 1951 63 y.o.  Admit date: 11/07/2014 Discharge date: 11-08-2014  Primary Diagnosis:  Medial compartment osteoarthritis, Left knee Admission Diagnoses:  Past Medical History  Diagnosis Date  . High cholesterol   . Asthma     allergy induced asthma +  . Pneumonia     hx of pneumonia   . Arthritis   . ADD (attention deficit disorder)    Discharge Diagnoses:   Principal Problem:   Osteoarthritis of left knee Active Problems:   OA (osteoarthritis) of knee  Estimated body mass index is 23.25 kg/(m^2) as calculated from the following:   Height as of this encounter: $RemoveBeforeD'5\' 6"'[id number]$  (1.676 m).   Weight as of this encounter: 65.318 kg (144 lb).  Procedure:  Procedure(s) (LRB): UNICOMPARTMENTAL LEFT KNEE REPLACEMENT (Left)   Consults: None  HPI: Nichole [name] is a 63 y.o. female, who has  significant isolated medial compartment arthritis of the Left knee. She has had nonoperative management including injections. She has had  cortisone and viscous supplements. Unfortunately, the pain persists.  Radiograph showed isolated medial compartment bone-on-bone arthritis  with normal-appearing patellofemoral and lateral compartments. She  presents now for left knee unicompartmental arthroplasty.   Laboratory Data: Admission on 11/07/2014  Component Date Value Ref Range Status  . ABO/RH(D) 11/07/2014 A POS   Final  . Antibody Screen 11/07/2014 NEG   Final  . Sample Expiration 11/07/2014 11/10/2014   Final  . ABO/RH(D) 11/07/2014 A POS   Final  . WBC 11/08/2014 10.5  4.0 - 10.5 K/uL Final  . RBC 11/08/2014 4.04  3.87 - 5.11 MIL/uL Final  . Hemoglobin 11/08/2014 12.5  12.0 - 15.0 g/dL Final  . HCT 11/08/2014 35.8* 36.0 - 46.0 % Final  . MCV 11/08/2014 88.6  78.0 - 100.0 fL Final  . MCH 11/08/2014 30.9  26.0 - 34.0 pg Final  . MCHC 11/08/2014 34.9  30.0 - 36.0 g/dL Final  . RDW 11/08/2014 12.7  11.5  - 15.5 % Final  . Platelets 11/08/2014 232  150 - 400 K/uL Final  . Sodium 11/08/2014 138  137 - 147 mEq/L Final  . Potassium 11/08/2014 4.3  3.7 - 5.3 mEq/L Final  . Chloride 11/08/2014 101  96 - 112 mEq/L Final  . CO2 11/08/2014 26  19 - 32 mEq/L Final  . Glucose, Bld 11/08/2014 167* 70 - 99 mg/dL Final  . BUN 11/08/2014 15  6 - 23 mg/dL Final  . Creatinine, Ser 11/08/2014 0.68  0.50 - 1.10 mg/dL Final  . Calcium 11/08/2014 9.7  8.4 - 10.5 mg/dL Final  . GFR calc non Af Amer 11/08/2014 90* >90 mL/min Final  . GFR calc Af Amer 11/08/2014 >90  >90 mL/min Final   Comment: (NOTE) The eGFR has been calculated using the CKD EPI equation. This calculation has not been validated in all clinical situations. eGFR's persistently <90 mL/min signify possible Chronic Kidney Disease.   Georgiann Hahn gap 11/08/2014 11  5 - 15 Final  Hospital Outpatient Visit on 10/27/2014  Component Date Value Ref Range Status  . aPTT 10/27/2014 27  24 - 37 seconds Final  . WBC 10/27/2014 5.0  4.0 - 10.5 K/uL Final  . RBC 10/27/2014 4.56  3.87 - 5.11 MIL/uL Final  . Hemoglobin 10/27/2014 13.7  12.0 - 15.0 g/dL Final  . HCT 10/27/2014 40.1  36.0 - 46.0 % Final  . MCV 10/27/2014 87.9  78.0 - 100.0 fL  Final  . MCH 10/27/2014 30.0  26.0 - 34.0 pg Final  . MCHC 10/27/2014 34.2  30.0 - 36.0 g/dL Final  . RDW 10/27/2014 12.7  11.5 - 15.5 % Final  . Platelets 10/27/2014 251  150 - 400 K/uL Final  . Sodium 10/27/2014 137  137 - 147 mEq/L Final  . Potassium 10/27/2014 4.3  3.7 - 5.3 mEq/L Final  . Chloride 10/27/2014 97  96 - 112 mEq/L Final  . CO2 10/27/2014 31  19 - 32 mEq/L Final  . Glucose, Bld 10/27/2014 99  70 - 99 mg/dL Final  . BUN 10/27/2014 19  6 - 23 mg/dL Final  . Creatinine, Ser 10/27/2014 0.85  0.50 - 1.10 mg/dL Final  . Calcium 10/27/2014 10.2  8.4 - 10.5 mg/dL Final  . Total Protein 10/27/2014 7.0  6.0 - 8.3 g/dL Final  . Albumin 10/27/2014 4.1  3.5 - 5.2 g/dL Final  . AST 10/27/2014 25  0 - 37 U/L Final    . ALT 10/27/2014 18  0 - 35 U/L Final  . Alkaline Phosphatase 10/27/2014 70  39 - 117 U/L Final  . Total Bilirubin 10/27/2014 0.6  0.3 - 1.2 mg/dL Final  . GFR calc non Af Amer 10/27/2014 70* >90 mL/min Final  . GFR calc Af Amer 10/27/2014 82* >90 mL/min Final   Comment: (NOTE) The eGFR has been calculated using the CKD EPI equation. This calculation has not been validated in all clinical situations. eGFR's persistently <90 mL/min signify possible Chronic Kidney Disease.   . Anion gap 10/27/2014 9  5 - 15 Final  . Prothrombin Time 10/27/2014 13.0  11.6 - 15.2 seconds Final  . INR 10/27/2014 0.97  0.00 - 1.49 Final  . Color, Urine 10/27/2014 YELLOW  YELLOW Final  . APPearance 10/27/2014 CLEAR  CLEAR Final  . Specific Gravity, Urine 10/27/2014 1.017  1.005 - 1.030 Final  . pH 10/27/2014 6.5  5.0 - 8.0 Final  . Glucose, UA 10/27/2014 NEGATIVE  NEGATIVE mg/dL Final  . Hgb urine dipstick 10/27/2014 TRACE* NEGATIVE Final  . Bilirubin Urine 10/27/2014 NEGATIVE  NEGATIVE Final  . Ketones, ur 10/27/2014 NEGATIVE  NEGATIVE mg/dL Final  . Protein, ur 10/27/2014 NEGATIVE  NEGATIVE mg/dL Final  . Urobilinogen, UA 10/27/2014 0.2  0.0 - 1.0 mg/dL Final  . Nitrite 10/27/2014 NEGATIVE  NEGATIVE Final  . Leukocytes, UA 10/27/2014 MODERATE* NEGATIVE Final  . MRSA, PCR 10/27/2014 NEGATIVE  NEGATIVE Final  . Staphylococcus aureus 10/27/2014 NEGATIVE  NEGATIVE Final   Comment:        The Xpert SA Assay (FDA approved for NASAL specimens in patients over 77 years of age), is one component of a comprehensive surveillance program.  Test performance has been validated by [employer] for patients greater than or equal to 82 year old. It is not intended to diagnose infection nor to guide or monitor treatment.   . Squamous Epithelial / LPF 10/27/2014 FEW* RARE Final  . WBC, UA 10/27/2014 3-6  <3 WBC/hpf Final  . RBC / HPF 10/27/2014 0-2  <3 RBC/hpf Final     X-Rays:No results found.  EKG:No  orders found for this or any previous visit.   Hospital Course: Nichole [name] is a 63 y.o. who was admitted to [facility]. They were brought to the operating room on 11/07/2014 and underwent Procedure(s): UNICOMPARTMENTAL LEFT KNEE REPLACEMENT.  Patient tolerated the procedure well and was later transferred to the recovery room and then to the orthopaedic floor for  postoperative care.  They were given PO and IV analgesics for pain control following their surgery.  They were given 24 hours of postoperative antibiotics of  Anti-infectives    Start     Dose/Rate Route Frequency Ordered Stop   11/07/14 1900  ceFAZolin (ANCEF) IVPB 2 g/50 mL premix     2 g100 mL/hr over 30 Minutes Intravenous Every 6 hours 11/07/14 1558 11/08/14 0159   11/07/14 0940  ceFAZolin (ANCEF) IVPB 2 g/50 mL premix     2 g100 mL/hr over 30 Minutes Intravenous On call to O.R. 11/07/14 0940 11/07/14 1244     and started on DVT prophylaxis in the form of Xarelto.   PT and OT were ordered for total joint protocol.  Discharge planning consulted to help with postop disposition and equipment needs.  Patient had a decent night on the evening of surgery.  They started to get up OOB with therapy on day one. Hemovac drain was pulled without difficulty.  Dressing was clean and dry.  Patient was seen in rounds on day one by Dr. Lequita Halt and was felt to be ready to go home following therapy.  Diet - Regular diet Follow up - in 2 weeks on Tuesday 11/22/2014 with Dr. Lequita Halt Activity - WBAT Disposition - Home Condition Upon Discharge - improving D/C Meds - See DC Summary DVT Prophylaxis - Xarelto for two weeks and then resume home Aspirin    Discharge Instructions    Call MD / Call 911    Complete by:  As directed   If you experience chest pain or shortness of breath, CALL 911 and be transported to the hospital emergency room.  If you develope a fever above 101 F, pus (white drainage) or increased drainage or redness at the  wound, or calf pain, call your surgeon's office.     Change dressing    Complete by:  As directed   Change dressing daily with sterile 4 x 4 inch gauze dressing and apply TED hose. Do not submerge the incision under water.     Constipation Prevention    Complete by:  As directed   Drink plenty of fluids.  Prune juice may be helpful.  You may use a stool softener, such as Colace (over the counter) 100 mg twice a day.  Use MiraLax (over the counter) for constipation as needed.     Diet general    Complete by:  As directed      Discharge instructions    Complete by:  As directed   Pick up stool softner and laxative for home. Do not submerge incision under water. May shower. Continue to use ice for pain and swelling from surgery.  Take Xarelto for two weeks, then discontinue Xarelto. Once the patient has completed the Xarelto, they may resume the 81 mg Aspirin.  Postoperative Constipation Protocol  Constipation - defined medically as fewer than three stools per week and severe constipation as less than one stool per week.  One of the most common issues patients have following surgery is constipation.  Even if you have a regular bowel pattern at home, your normal regimen is likely to be disrupted due to multiple reasons following surgery.  Combination of anesthesia, postoperative narcotics, change in appetite and fluid intake all can affect your bowels.  In order to avoid complications following surgery, here are some recommendations in order to help you during your recovery period.  Colace (docusate) - Pick up an over-the-counter form of Colace or another  stool softener and take twice a day as long as you are requiring postoperative pain medications.  Take with a full glass of water daily.  If you experience loose stools or diarrhea, hold the colace until you stool forms back up.  If your symptoms do not get better within 1 week or if they get worse, check with your doctor.  Dulcolax  (bisacodyl) - Pick up over-the-counter and take as directed by the product packaging as needed to assist with the movement of your bowels.  Take with a full glass of water.  Use this product as needed if not relieved by Colace only.   MiraLax (polyethylene glycol) - Pick up over-the-counter to have on hand.  MiraLax is a solution that will increase the amount of water in your bowels to assist with bowel movements.  Take as directed and can mix with a glass of water, juice, soda, coffee, or tea.  Take if you go more than two days without a movement. Do not use MiraLax more than once per day. Call your doctor if you are still constipated or irregular after using this medication for 7 days in a row.  If you continue to have problems with postoperative constipation, please contact the office for further assistance and recommendations.  If you experience "the worst abdominal pain ever" or develop nausea or vomiting, please contact the office immediatly for further recommendations for treatment.     Do not put a pillow under the knee. Place it under the heel.    Complete by:  As directed      Do not sit on low chairs, stoools or toilet seats, as it may be difficult to get up from low surfaces    Complete by:  As directed      Driving restrictions    Complete by:  As directed   No driving until released by the physician.     Increase activity slowly as tolerated    Complete by:  As directed      Lifting restrictions    Complete by:  As directed   No lifting until released by the physician.     Patient may shower    Complete by:  As directed   You may shower without a dressing once there is no drainage.  Do not wash over the wound.  If drainage remains, do not shower until drainage stops.     TED hose    Complete by:  As directed   Use stockings (TED hose) for 3 weeks on both leg(s).  You may remove them at night for sleeping.     Weight bearing as tolerated    Complete by:  As directed               Medication List    STOP taking these medications        aspirin 81 MG tablet     CALCIUM CITRATE + D3 PO     cholecalciferol 1000 UNITS tablet  Commonly known as:  VITAMIN D     CO Q-10 PLUS RED YEAST RICE PO     EMERGEN-C VITAMIN C PO     Fish Oil 1200 MG Caps     Fish Oil Oil     ibuprofen 200 MG tablet  Commonly known as:  ADVIL,MOTRIN     RED YEAST RICE PO     Vitamin D 2000 UNITS Caps      TAKE these medications  albuterol 108 (90 BASE) MCG/ACT inhaler  Commonly known as:  PROVENTIL HFA;VENTOLIN HFA  Inhale 1-2 puffs into the lungs every 6 (six) hours as needed for wheezing or shortness of breath.     CLARITIN 10 MG tablet  Generic drug:  loratadine  Take 10 mg by mouth every morning.     methocarbamol 500 MG tablet  Commonly known as:  ROBAXIN  Take 1 tablet (500 mg total) by mouth every 6 (six) hours as needed for muscle spasms.     methylphenidate 10 MG tablet  Commonly known as:  RITALIN  Take 10 mg by mouth 3 (three) times daily with meals.     NYQUIL PO  Take by mouth. Patient taken on 10/25/2014 for cold like symptoms.     oxyCODONE 5 MG immediate release tablet  Commonly known as:  Oxy IR/ROXICODONE  Take 1-2 tablets (5-10 mg total) by mouth every 3 (three) hours as needed for moderate pain, severe pain or breakthrough pain.     rivaroxaban 10 MG Tabs tablet  Commonly known as:  XARELTO  - Take 1 tablet (10 mg total) by mouth daily with breakfast. Take Xarelto for two weeks, then discontinue Xarelto.  - Once the patient has completed the Xarelto, they may resume the 81 mg Aspirin.     traMADol 50 MG tablet  Commonly known as:  ULTRAM  Take 1-2 tablets (50-100 mg total) by mouth every 6 (six) hours as needed (mild pain).     TYLENOL EXTRA STRENGTH PO  Take 500 mg by mouth 2 (two) times daily.           Follow-up Information    Follow up with Gearlean Alf, MD. Schedule an appointment as soon as possible for a visit on  11/22/2014.   Specialty:  Orthopedic Surgery   Why:  Call office at [phone] to setup follow up appointment on Tuesday 11/22/2014.   Contact information:   [address] [place] [address] [phone]       Signed: Arlee Muslim, PA-C Orthopaedic Surgery 11/08/2014, 9:34 AM

## 2014-11-08 NOTE — Evaluation (Signed)
Occupational Therapy Evaluation Patient Details Name: Nichole [name] MRN: [id number] DOB: [birth year] Today's Date: November 12, 2014    History of Present Illness L TKR   Clinical Impression   All education complete s/p TKR regarding ADL activity              Precautions / Restrictions Precautions Precautions: None Restrictions Weight Bearing Restrictions: No      Mobility Bed Mobility               General bed mobility comments: pt in chair  Transfers Overall transfer level: Needs assistance Equipment used: Rolling walker (2 wheeled) Transfers: Sit to/from Stand Sit to Stand: Supervision                   ADL Overall ADL's : Needs assistance/impaired                     Lower Body Dressing: Minimal assistance;Sit to/from stand   Toilet Transfer: Supervision/safety;RW;Cueing for sequencing   Toileting- Water quality scientist and Hygiene: Supervision/safety;Sit to/from stand   Tub/ Banker: Walk-in Sports coach Details (indicate cue type and reason): verbal cues for safety Functional mobility during ADLs: Supervision/safety;Rolling walker                 Pertinent Vitals/Pain Pain Assessment: 0-10 Pain Score: 2  Pain Location: L knee Pain Descriptors / Indicators: Sore Pain Intervention(s): Monitored during session;Repositioned;Ice applied     Hand Dominance     Extremity/Trunk Assessment Upper Extremity Assessment Upper Extremity Assessment: Overall WFL for tasks assessed           Communication Communication Communication: No difficulties   Cognition Arousal/Alertness: Awake/alert Behavior During Therapy: WFL for tasks assessed/performed Overall Cognitive Status: Within Functional Limits for tasks assessed                     General Comments    Husband will A as needed           Home Living Family/patient expects to be discharged to:: Private  residence Living Arrangements: Spouse/significant other Available Help at Discharge: Family Type of Home: House Home Access: Stairs to enter [employer] of Steps: 2 Entrance Stairs-Rails: None Home Layout: Two level;Full bath on main level Alternate Level Stairs-Number of Steps: 12 Alternate Level Stairs-Rails: Left Bathroom Shower/Tub: Occupational psychologist: Standard     Home Equipment: Environmental consultant - 2 wheels                        OT Goals(Current goals can be found in the care plan section) Acute Rehab OT Goals Patient Stated Goal: get back to my normal activity OT Goal Formulation: All assessment and education complete, DC therapy  OT Frequency:     Barriers to D/C:               End of Session  Activity Tolerance:  good Patient left:   Pt left in chair with husband present and call bell with in reach   Time: 1102-1122 OT Time Calculation (min): 20 min Charges:  OT General Charges $OT Visit: 1 Procedure OT Evaluation $Initial OT Evaluation Tier I: 1 Procedure OT Treatments $Self Care/Home Management : 8-22 mins G-Codes:    Payton Mccallum D 11/12/2014, 11:33 AM

## 2014-11-08 NOTE — Progress Notes (Signed)
   Subjective: 1 Day Post-Op Procedure(s) (LRB): UNICOMPARTMENTAL LEFT KNEE REPLACEMENT (Left) Patient reports pain as mild.   Patient seen in rounds with Dr. Wynelle Link.  Husband in room. Patient is well, but has had some minor complaints of pain in the knee, requiring pain medications Patient is ready to go home later today following therapy.  Objective: Vital signs in last 24 hours: Temp:  [97.4 F (36.3 C)-98.2 F (36.8 C)] 97.8 F (36.6 C) (11/17 0600) Pulse Rate:  [51-77] 53 (11/17 0600) Resp:  [10-18] 16 (11/17 0600) BP: (105-140)/(55-71) 124/70 mmHg (11/17 0600) SpO2:  [93 %-100 %] 96 % (11/17 0600) Weight:  [65.318 kg (144 lb)] 65.318 kg (144 lb) (11/16 0950)  Intake/Output from previous day:  Intake/Output Summary (Last 24 hours) at 11/08/14 0732 Last data filed at 11/08/14 0641  Gross per 24 hour  Intake 3126.25 ml  Output   2635 ml  Net 491.25 ml    Labs:  Recent Labs  11/08/14 0455  HGB 12.5    Recent Labs  11/08/14 0455  WBC 10.5  RBC 4.04  HCT 35.8*  PLT 232    Recent Labs  11/08/14 0455  NA 138  K 4.3  CL 101  CO2 26  BUN 15  CREATININE 0.68  GLUCOSE 167*  CALCIUM 9.7   No results for input(s): LABPT, INR in the last 72 hours.  EXAM: General - Patient is Alert, Appropriate and Oriented Extremity - Neurovascular intact Sensation intact distally Dorsiflexion/Plantar flexion intact Dressing - clean, dry, no drainage Motor Function - intact, moving foot and toes well on exam.   Assessment/Plan: 1 Day Post-Op Procedure(s) (LRB): UNICOMPARTMENTAL LEFT KNEE REPLACEMENT (Left) Procedure(s) (LRB): UNICOMPARTMENTAL LEFT KNEE REPLACEMENT (Left) Past Medical History  Diagnosis Date  . High cholesterol   . Asthma     allergy induced asthma +  . Pneumonia     hx of pneumonia   . Arthritis   . ADD (attention deficit disorder)    Principal Problem:   Osteoarthritis of left knee Active Problems:   OA (osteoarthritis) of  knee  Estimated body mass index is 23.25 kg/(m^2) as calculated from the following:   Height as of this encounter: 5\' 6"  (1.676 m).   Weight as of this encounter: 65.318 kg (144 lb). Up with therapy Discharge home with home health following therapy Diet - Regular diet Follow up - in 2 weeks on Tuesday 11/22/2014 with Dr. Wynelle Link Activity - WBAT Disposition - Home Condition Upon Discharge - improving D/C Meds - See DC Summary DVT Prophylaxis - Xarelto for two weeks and then Aspirin for four weeks.  Nichole Muslim, PA-C Orthopaedic Surgery 11/08/2014, 7:32 AM

## 2014-11-08 NOTE — Progress Notes (Signed)
Utilization review completed.  

## 2014-11-09 NOTE — Progress Notes (Signed)
Discharge summary sent to payer through [facility]  

## 2014-11-11 NOTE — Addendum Note (Signed)
Addendum  created 11/11/14 1421 by Myrtie Soman, MD   Modules edited: Anesthesia Events, Narrator   Narrator:  Narrator: Event Log Edited

## 2015-06-19 ENCOUNTER — Other Ambulatory Visit: Payer: Self-pay

## 2016-02-12 DIAGNOSIS — J101 Influenza due to other identified influenza virus with other respiratory manifestations: Secondary | ICD-10-CM | POA: Diagnosis not present

## 2016-02-12 DIAGNOSIS — R509 Fever, unspecified: Secondary | ICD-10-CM | POA: Diagnosis not present

## 2016-02-19 DIAGNOSIS — R05 Cough: Secondary | ICD-10-CM | POA: Diagnosis not present

## 2016-02-19 DIAGNOSIS — J209 Acute bronchitis, unspecified: Secondary | ICD-10-CM | POA: Diagnosis not present

## 2016-05-03 DIAGNOSIS — Z23 Encounter for immunization: Secondary | ICD-10-CM | POA: Diagnosis not present

## 2016-06-19 DIAGNOSIS — Z124 Encounter for screening for malignant neoplasm of cervix: Secondary | ICD-10-CM | POA: Diagnosis not present

## 2016-06-19 DIAGNOSIS — Z6823 Body mass index (BMI) 23.0-23.9, adult: Secondary | ICD-10-CM | POA: Diagnosis not present

## 2016-06-19 DIAGNOSIS — Z1231 Encounter for screening mammogram for malignant neoplasm of breast: Secondary | ICD-10-CM | POA: Diagnosis not present

## 2016-06-19 DIAGNOSIS — Z01419 Encounter for gynecological examination (general) (routine) without abnormal findings: Secondary | ICD-10-CM | POA: Diagnosis not present

## 2016-07-02 DIAGNOSIS — N958 Other specified menopausal and perimenopausal disorders: Secondary | ICD-10-CM | POA: Diagnosis not present

## 2016-07-02 DIAGNOSIS — M8588 Other specified disorders of bone density and structure, other site: Secondary | ICD-10-CM | POA: Diagnosis not present

## 2016-07-11 DIAGNOSIS — Z1211 Encounter for screening for malignant neoplasm of colon: Secondary | ICD-10-CM | POA: Diagnosis not present

## 2016-07-11 DIAGNOSIS — Z800 Family history of malignant neoplasm of digestive organs: Secondary | ICD-10-CM | POA: Diagnosis not present

## 2016-08-19 DIAGNOSIS — Z800 Family history of malignant neoplasm of digestive organs: Secondary | ICD-10-CM | POA: Diagnosis not present

## 2016-08-19 DIAGNOSIS — Z1211 Encounter for screening for malignant neoplasm of colon: Secondary | ICD-10-CM | POA: Diagnosis not present

## 2016-09-11 DIAGNOSIS — L820 Inflamed seborrheic keratosis: Secondary | ICD-10-CM | POA: Diagnosis not present

## 2016-09-11 DIAGNOSIS — D225 Melanocytic nevi of trunk: Secondary | ICD-10-CM | POA: Diagnosis not present

## 2016-09-11 DIAGNOSIS — L918 Other hypertrophic disorders of the skin: Secondary | ICD-10-CM | POA: Diagnosis not present

## 2016-09-11 DIAGNOSIS — D1801 Hemangioma of skin and subcutaneous tissue: Secondary | ICD-10-CM | POA: Diagnosis not present

## 2016-10-10 DIAGNOSIS — Z23 Encounter for immunization: Secondary | ICD-10-CM | POA: Diagnosis not present

## 2016-10-10 DIAGNOSIS — R5383 Other fatigue: Secondary | ICD-10-CM | POA: Diagnosis not present

## 2016-10-10 DIAGNOSIS — E7800 Pure hypercholesterolemia, unspecified: Secondary | ICD-10-CM | POA: Diagnosis not present

## 2016-10-10 DIAGNOSIS — Z0000 Encounter for general adult medical examination without abnormal findings: Secondary | ICD-10-CM | POA: Diagnosis not present

## 2016-10-10 DIAGNOSIS — F900 Attention-deficit hyperactivity disorder, predominantly inattentive type: Secondary | ICD-10-CM | POA: Diagnosis not present

## 2016-10-10 DIAGNOSIS — Z1211 Encounter for screening for malignant neoplasm of colon: Secondary | ICD-10-CM | POA: Diagnosis not present

## 2016-10-10 DIAGNOSIS — Z791 Long term (current) use of non-steroidal anti-inflammatories (NSAID): Secondary | ICD-10-CM | POA: Diagnosis not present

## 2016-10-10 DIAGNOSIS — R7309 Other abnormal glucose: Secondary | ICD-10-CM | POA: Diagnosis not present

## 2016-12-27 DIAGNOSIS — J209 Acute bronchitis, unspecified: Secondary | ICD-10-CM | POA: Diagnosis not present

## 2017-01-16 DIAGNOSIS — D225 Melanocytic nevi of trunk: Secondary | ICD-10-CM | POA: Diagnosis not present

## 2017-01-16 DIAGNOSIS — D1801 Hemangioma of skin and subcutaneous tissue: Secondary | ICD-10-CM | POA: Diagnosis not present

## 2017-01-16 DIAGNOSIS — D2272 Melanocytic nevi of left lower limb, including hip: Secondary | ICD-10-CM | POA: Diagnosis not present

## 2017-01-16 DIAGNOSIS — L918 Other hypertrophic disorders of the skin: Secondary | ICD-10-CM | POA: Diagnosis not present

## 2017-01-16 DIAGNOSIS — L814 Other melanin hyperpigmentation: Secondary | ICD-10-CM | POA: Diagnosis not present

## 2017-01-16 DIAGNOSIS — L821 Other seborrheic keratosis: Secondary | ICD-10-CM | POA: Diagnosis not present

## 2017-01-16 DIAGNOSIS — L812 Freckles: Secondary | ICD-10-CM | POA: Diagnosis not present

## 2017-01-16 DIAGNOSIS — L819 Disorder of pigmentation, unspecified: Secondary | ICD-10-CM | POA: Diagnosis not present

## 2017-04-24 DIAGNOSIS — R1012 Left upper quadrant pain: Secondary | ICD-10-CM | POA: Diagnosis not present

## 2017-04-24 DIAGNOSIS — Z800 Family history of malignant neoplasm of digestive organs: Secondary | ICD-10-CM | POA: Diagnosis not present

## 2017-04-24 DIAGNOSIS — R194 Change in bowel habit: Secondary | ICD-10-CM | POA: Diagnosis not present

## 2017-05-01 DIAGNOSIS — K219 Gastro-esophageal reflux disease without esophagitis: Secondary | ICD-10-CM | POA: Diagnosis not present

## 2017-05-01 DIAGNOSIS — R194 Change in bowel habit: Secondary | ICD-10-CM | POA: Diagnosis not present

## 2017-05-01 DIAGNOSIS — R1012 Left upper quadrant pain: Secondary | ICD-10-CM | POA: Diagnosis not present

## 2017-05-22 DIAGNOSIS — R140 Abdominal distension (gaseous): Secondary | ICD-10-CM | POA: Diagnosis not present

## 2017-05-22 DIAGNOSIS — Z800 Family history of malignant neoplasm of digestive organs: Secondary | ICD-10-CM | POA: Diagnosis not present

## 2017-10-16 DIAGNOSIS — E7800 Pure hypercholesterolemia, unspecified: Secondary | ICD-10-CM | POA: Diagnosis not present

## 2017-10-16 DIAGNOSIS — Z1211 Encounter for screening for malignant neoplasm of colon: Secondary | ICD-10-CM | POA: Diagnosis not present

## 2017-10-16 DIAGNOSIS — Z1159 Encounter for screening for other viral diseases: Secondary | ICD-10-CM | POA: Diagnosis not present

## 2017-10-16 DIAGNOSIS — Z0000 Encounter for general adult medical examination without abnormal findings: Secondary | ICD-10-CM | POA: Diagnosis not present

## 2017-10-16 DIAGNOSIS — R7301 Impaired fasting glucose: Secondary | ICD-10-CM | POA: Diagnosis not present

## 2017-10-16 DIAGNOSIS — G4710 Hypersomnia, unspecified: Secondary | ICD-10-CM | POA: Diagnosis not present

## 2017-12-22 DIAGNOSIS — Z23 Encounter for immunization: Secondary | ICD-10-CM | POA: Diagnosis not present

## 2017-12-31 ENCOUNTER — Institutional Professional Consult (permissible substitution): Payer: Commercial Managed Care - HMO | Admitting: Neurology

## 2017-12-31 ENCOUNTER — Other Ambulatory Visit: Payer: Self-pay | Admitting: Family Medicine

## 2017-12-31 DIAGNOSIS — Z1231 Encounter for screening mammogram for malignant neoplasm of breast: Secondary | ICD-10-CM

## 2018-01-01 ENCOUNTER — Other Ambulatory Visit: Payer: Self-pay | Admitting: Family Medicine

## 2018-01-01 DIAGNOSIS — E2839 Other primary ovarian failure: Secondary | ICD-10-CM

## 2018-01-29 ENCOUNTER — Ambulatory Visit
Admission: RE | Admit: 2018-01-29 | Discharge: 2018-01-29 | Disposition: A | Discharge status: Home / Self Care | Payer: PPO | Source: Ambulatory Visit | Attending: Family Medicine | Admitting: Family Medicine

## 2018-01-29 DIAGNOSIS — Z1231 Encounter for screening mammogram for malignant neoplasm of breast: Secondary | ICD-10-CM

## 2018-03-03 ENCOUNTER — Other Ambulatory Visit: Payer: Self-pay | Admitting: Family Medicine

## 2018-03-03 DIAGNOSIS — E2839 Other primary ovarian failure: Secondary | ICD-10-CM

## 2018-03-05 DIAGNOSIS — D2271 Melanocytic nevi of right lower limb, including hip: Secondary | ICD-10-CM | POA: Diagnosis not present

## 2018-03-05 DIAGNOSIS — D225 Melanocytic nevi of trunk: Secondary | ICD-10-CM | POA: Diagnosis not present

## 2018-03-05 DIAGNOSIS — L814 Other melanin hyperpigmentation: Secondary | ICD-10-CM | POA: Diagnosis not present

## 2018-03-05 DIAGNOSIS — D2272 Melanocytic nevi of left lower limb, including hip: Secondary | ICD-10-CM | POA: Diagnosis not present

## 2018-03-05 DIAGNOSIS — L821 Other seborrheic keratosis: Secondary | ICD-10-CM | POA: Diagnosis not present

## 2018-03-05 DIAGNOSIS — D1801 Hemangioma of skin and subcutaneous tissue: Secondary | ICD-10-CM | POA: Diagnosis not present

## 2018-04-29 DIAGNOSIS — F900 Attention-deficit hyperactivity disorder, predominantly inattentive type: Secondary | ICD-10-CM | POA: Diagnosis not present

## 2018-05-27 DIAGNOSIS — F900 Attention-deficit hyperactivity disorder, predominantly inattentive type: Secondary | ICD-10-CM | POA: Diagnosis not present

## 2018-07-03 ENCOUNTER — Ambulatory Visit
Admission: RE | Admit: 2018-07-03 | Discharge: 2018-07-03 | Disposition: A | Discharge status: Home / Self Care | Payer: Commercial Managed Care - HMO | Source: Ambulatory Visit | Attending: Family Medicine | Admitting: Family Medicine

## 2018-07-03 DIAGNOSIS — Z780 Asymptomatic menopausal state: Secondary | ICD-10-CM | POA: Diagnosis not present

## 2018-07-03 DIAGNOSIS — M85852 Other specified disorders of bone density and structure, left thigh: Secondary | ICD-10-CM | POA: Diagnosis not present

## 2018-07-03 DIAGNOSIS — E2839 Other primary ovarian failure: Secondary | ICD-10-CM

## 2018-10-21 DIAGNOSIS — J209 Acute bronchitis, unspecified: Secondary | ICD-10-CM | POA: Diagnosis not present

## 2018-11-03 DIAGNOSIS — R7301 Impaired fasting glucose: Secondary | ICD-10-CM | POA: Diagnosis not present

## 2018-11-03 DIAGNOSIS — F900 Attention-deficit hyperactivity disorder, predominantly inattentive type: Secondary | ICD-10-CM | POA: Diagnosis not present

## 2018-11-03 DIAGNOSIS — Z0000 Encounter for general adult medical examination without abnormal findings: Secondary | ICD-10-CM | POA: Diagnosis not present

## 2018-11-03 DIAGNOSIS — E7800 Pure hypercholesterolemia, unspecified: Secondary | ICD-10-CM | POA: Diagnosis not present

## 2018-11-03 DIAGNOSIS — M8588 Other specified disorders of bone density and structure, other site: Secondary | ICD-10-CM | POA: Diagnosis not present

## 2018-12-01 DIAGNOSIS — R7301 Impaired fasting glucose: Secondary | ICD-10-CM | POA: Diagnosis not present

## 2018-12-01 DIAGNOSIS — Z0000 Encounter for general adult medical examination without abnormal findings: Secondary | ICD-10-CM | POA: Diagnosis not present

## 2018-12-01 DIAGNOSIS — M8588 Other specified disorders of bone density and structure, other site: Secondary | ICD-10-CM | POA: Diagnosis not present

## 2018-12-01 DIAGNOSIS — E7800 Pure hypercholesterolemia, unspecified: Secondary | ICD-10-CM | POA: Diagnosis not present

## 2018-12-01 DIAGNOSIS — F900 Attention-deficit hyperactivity disorder, predominantly inattentive type: Secondary | ICD-10-CM | POA: Diagnosis not present

## 2019-04-14 DIAGNOSIS — L918 Other hypertrophic disorders of the skin: Secondary | ICD-10-CM | POA: Diagnosis not present

## 2019-04-14 DIAGNOSIS — L819 Disorder of pigmentation, unspecified: Secondary | ICD-10-CM | POA: Diagnosis not present

## 2019-04-14 DIAGNOSIS — D1801 Hemangioma of skin and subcutaneous tissue: Secondary | ICD-10-CM | POA: Diagnosis not present

## 2019-04-14 DIAGNOSIS — D225 Melanocytic nevi of trunk: Secondary | ICD-10-CM | POA: Diagnosis not present

## 2019-04-14 DIAGNOSIS — L570 Actinic keratosis: Secondary | ICD-10-CM | POA: Diagnosis not present

## 2019-04-14 DIAGNOSIS — L812 Freckles: Secondary | ICD-10-CM | POA: Diagnosis not present

## 2019-04-14 DIAGNOSIS — D2272 Melanocytic nevi of left lower limb, including hip: Secondary | ICD-10-CM | POA: Diagnosis not present

## 2019-04-14 DIAGNOSIS — L738 Other specified follicular disorders: Secondary | ICD-10-CM | POA: Diagnosis not present

## 2019-04-14 DIAGNOSIS — D2271 Melanocytic nevi of right lower limb, including hip: Secondary | ICD-10-CM | POA: Diagnosis not present

## 2019-04-14 DIAGNOSIS — L821 Other seborrheic keratosis: Secondary | ICD-10-CM | POA: Diagnosis not present

## 2019-05-03 DIAGNOSIS — F900 Attention-deficit hyperactivity disorder, predominantly inattentive type: Secondary | ICD-10-CM | POA: Diagnosis not present

## 2019-05-06 DIAGNOSIS — D72819 Decreased white blood cell count, unspecified: Secondary | ICD-10-CM | POA: Diagnosis not present

## 2019-05-06 DIAGNOSIS — M549 Dorsalgia, unspecified: Secondary | ICD-10-CM | POA: Diagnosis not present

## 2019-05-07 ENCOUNTER — Encounter (HOSPITAL_COMMUNITY): Payer: Self-pay | Admitting: Emergency Medicine

## 2019-05-07 ENCOUNTER — Emergency Department (HOSPITAL_COMMUNITY)
Admission: EM | Admit: 2019-05-07 | Discharge: 2019-05-07 | Disposition: A | Discharge status: Home / Self Care | Payer: PPO | Attending: Emergency Medicine | Admitting: Emergency Medicine

## 2019-05-07 ENCOUNTER — Other Ambulatory Visit: Payer: Self-pay

## 2019-05-07 ENCOUNTER — Emergency Department (HOSPITAL_COMMUNITY): Payer: PPO

## 2019-05-07 DIAGNOSIS — I700 Atherosclerosis of aorta: Secondary | ICD-10-CM | POA: Insufficient documentation

## 2019-05-07 DIAGNOSIS — M5441 Lumbago with sciatica, right side: Secondary | ICD-10-CM | POA: Diagnosis not present

## 2019-05-07 DIAGNOSIS — R109 Unspecified abdominal pain: Secondary | ICD-10-CM | POA: Diagnosis not present

## 2019-05-07 DIAGNOSIS — M545 Low back pain, unspecified: Secondary | ICD-10-CM

## 2019-05-07 DIAGNOSIS — M549 Dorsalgia, unspecified: Secondary | ICD-10-CM | POA: Diagnosis present

## 2019-05-07 DIAGNOSIS — J45909 Unspecified asthma, uncomplicated: Secondary | ICD-10-CM | POA: Diagnosis not present

## 2019-05-07 DIAGNOSIS — R52 Pain, unspecified: Secondary | ICD-10-CM

## 2019-05-07 DIAGNOSIS — Z79899 Other long term (current) drug therapy: Secondary | ICD-10-CM | POA: Insufficient documentation

## 2019-05-07 LAB — URINALYSIS, ROUTINE W REFLEX MICROSCOPIC
Bacteria, UA: NONE SEEN
Bilirubin Urine: NEGATIVE
Glucose, UA: NEGATIVE mg/dL
Hgb urine dipstick: NEGATIVE
Ketones, ur: 5 mg/dL — AB
Nitrite: NEGATIVE
Protein, ur: NEGATIVE mg/dL
Specific Gravity, Urine: 1.027 (ref 1.005–1.030)
pH: 5 (ref 5.0–8.0)

## 2019-05-07 MED ORDER — HYDROCODONE-ACETAMINOPHEN 5-325 MG PO TABS: 1.0000 | ORAL_TABLET | Freq: Four times a day (QID) | ORAL | 0 refills | Status: DC | PRN

## 2019-05-07 MED ORDER — KETOROLAC TROMETHAMINE 60 MG/2ML IM SOLN
15.0000 mg | Freq: Once | INTRAMUSCULAR | Status: AC
  Administered 2019-05-07: 15 mg via INTRAMUSCULAR
  Filled 2019-05-07: qty 2

## 2019-05-07 MED ORDER — DIAZEPAM 2 MG PO TABS
2.0000 mg | ORAL_TABLET | Freq: Once | ORAL | Status: AC
  Administered 2019-05-07: 16:00:00 2 mg via ORAL
  Filled 2019-05-07: qty 1

## 2019-05-07 MED ORDER — OXYCODONE HCL 5 MG PO TABS
2.5000 mg | ORAL_TABLET | Freq: Once | ORAL | Status: AC
  Administered 2019-05-07: 16:00:00 2.5 mg via ORAL
  Filled 2019-05-07: qty 1

## 2019-05-07 MED ORDER — CEPHALEXIN 500 MG PO CAPS
500.0000 mg | ORAL_CAPSULE | Freq: Four times a day (QID) | ORAL | 0 refills | Status: DC
Start: 2019-05-07 — End: 2022-01-01

## 2019-05-07 NOTE — Discharge Instructions (Addendum)
Take 4 over the counter ibuprofen tablets 3 times a day or 2 over-the-counter naproxen tablets twice a day for pain. Also take tylenol 1000mg(2 extra strength) four times a day.    

## 2019-05-07 NOTE — ED Notes (Signed)
Bed: WTR8 Expected date:  Expected time:  Means of arrival:  Comments: 

## 2019-05-07 NOTE — ED Triage Notes (Signed)
Per pt, states she was in the grocery store yesterday when her back started spasm ing-states she went to walk in clinic and was given a muscle relaxer-states also had urine done and it was cloudy-states in injury, trauma to back, no dysuria-was told to come to ED if symptoms worsened

## 2019-05-07 NOTE — ED Provider Notes (Signed)
Patient seen after signout from prior ED provider.  On my evaluation the patient is significantly improved.  She reports minimal pain or discomfort.  CT did not show significant acute pathology.  UA does not demonstrate clear evidence of infection.  Patient is advised to continue her current course of nitrofurantoin.  Patient desires discharge.  She does understand the need for close follow-up.  Strict return precautions given and understood.   Valarie Merino, MD 05/07/19 1843

## 2019-05-07 NOTE — ED Provider Notes (Signed)
[facility] DEPT Provider Note   CSN: [id number] Arrival date & time: 05/07/19  1501    History   Chief Complaint Chief Complaint  Patient presents with  . Back Pain    HPI Nichole [name] is a 68 y.o. female.     68 yo F with a cc of left sided back pain.  Going on for the past couple days.  Started spontaneously in the grocery store.  Happened after walking about 3 miles.  Denies trauma.  Denies fever.  Seen at walk in clinic with small leukocyte esterase and cloudy urine and started on nitrofurantoin.  Also given Skelaxin.  Patient has had no significant improvement.  Denies loss of bowel or bladder denies loss of peritoneal sensation denies numbness or weakness in the leg.  The history is provided by the patient.  Back Pain  Location:  Lumbar spine Quality:  Aching and cramping Radiates to:  Does not radiate Pain severity:  Moderate Onset quality:  Gradual Duration:  2 days Timing:  Constant Progression:  Worsening Chronicity:  New Relieved by:  Nothing Worsened by:  Nothing Ineffective treatments:  None tried Associated symptoms: no chest pain, no dysuria, no fever and no headaches     Past Medical History:  Diagnosis Date  . ADD (attention deficit disorder)   . Arthritis   . Asthma    allergy induced asthma +  . High cholesterol   . Osteoarthritis   . Pneumonia    hx of pneumonia     Patient Active Problem List   Diagnosis Date Noted  . OA (osteoarthritis) of knee 11/07/2014  . Knee pain, chronic 12/28/2013  . Osteoarthritis of left knee 12/28/2013    Past Surgical History:  Procedure Laterality Date  . COLONOSCOPY    . PARTIAL KNEE ARTHROPLASTY Left 11/07/2014   Procedure: UNICOMPARTMENTAL LEFT KNEE REPLACEMENT;  Surgeon: Gearlean Alf, MD;  Location: [facility];  Service: Orthopedics;  Laterality: Left;  . TONSILLECTOMY     age 37     OB History   No obstetric history on file.      Home Medications    Prior  to Admission medications   Medication Sig Start Date End Date Taking? Authorizing Provider  Acetaminophen (TYLENOL EXTRA STRENGTH PO) Take 500 mg by mouth 2 (two) times daily.     [provider]  albuterol (PROVENTIL HFA;VENTOLIN HFA) 108 (90 BASE) MCG/ACT inhaler Inhale 1-2 puffs into the lungs every 6 (six) hours as needed for wheezing or shortness of breath.    [provider]  cephALEXin (KEFLEX) 500 MG capsule Take 1 capsule (500 mg total) by mouth 4 (four) times daily. 05/07/19   [name], DO  loratadine (CLARITIN) 10 MG tablet Take 10 mg by mouth every morning.     [provider]  methocarbamol (ROBAXIN) 500 MG tablet Take 1 tablet (500 mg total) by mouth every 6 (six) hours as needed for muscle spasms. 11/08/14   Perkins, Alexzandrew L, PA-C  methylphenidate (RITALIN) 10 MG tablet Take 10 mg by mouth 3 (three) times daily with meals.    [provider]  oxyCODONE (OXY IR/ROXICODONE) 5 MG immediate release tablet Take 1-2 tablets (5-10 mg total) by mouth every 3 (three) hours as needed for moderate pain, severe pain or breakthrough pain. 11/08/14   Perkins, Alexzandrew L, PA-C  Pseudoeph-Doxylamine-DM-APAP (NYQUIL PO) Take by mouth. Patient taken on 10/25/2014 for cold like symptoms.    [provider]  rivaroxaban (  XARELTO) 10 MG TABS tablet Take 1 tablet (10 mg total) by mouth daily with breakfast. Take Xarelto for two weeks, then discontinue Xarelto. Once the patient has completed the Xarelto, they may resume the 81 mg Aspirin. 11/08/14   Perkins, Alexzandrew L, PA-C  traMADol (ULTRAM) 50 MG tablet Take 1-2 tablets (50-100 mg total) by mouth every 6 (six) hours as needed (mild pain). 11/08/14   Perkins, Alexzandrew L, PA-C    Family History Family History  Problem Relation Age of Onset  . Cancer Mother        uterine  . Dementia Mother   . Cancer - Colon Father   . COPD Father   . Fibromyalgia Sister   . Cancer - Colon Brother   .  Hepatitis C Brother   . Cancer Maternal Grandmother        kidney  . Dementia Maternal Grandfather   . Neuropathy Son 30       multi focal motor neuropathy    Social History Social History   Tobacco Use  . Smoking status: Never Smoker  . Smokeless tobacco: Never Used  Substance Use Topics  . Alcohol use: No  . Drug use: No     Allergies   Sulfa antibiotics   Review of Systems Review of Systems  Constitutional: Negative for chills and fever.  HENT: Negative for congestion and rhinorrhea.   Eyes: Negative for redness and visual disturbance.  Respiratory: Negative for shortness of breath and wheezing.   Cardiovascular: Negative for chest pain and palpitations.  Gastrointestinal: Negative for nausea and vomiting.  Genitourinary: Negative for dysuria and urgency.  Musculoskeletal: Positive for back pain. Negative for arthralgias and myalgias.  Skin: Negative for pallor and wound.  Neurological: Negative for dizziness and headaches.     Physical Exam Updated Vital Signs BP (!) 142/73 (BP Location: Left Arm)   Pulse 72   Temp 98.1 F (36.7 C) (Oral)   Resp 18   SpO2 98%   Physical Exam Vitals signs and nursing note reviewed.  Constitutional:      General: She is not in acute distress.    Appearance: She is well-developed. She is not diaphoretic.  HENT:     Head: Normocephalic and atraumatic.  Eyes:     Pupils: Pupils are equal, round, and reactive to light.  Neck:     Musculoskeletal: Normal range of motion and neck supple.  Cardiovascular:     Rate and Rhythm: Normal rate and regular rhythm.     Heart sounds: No murmur. No friction rub. No gallop.   Pulmonary:     Effort: Pulmonary effort is normal.     Breath sounds: No wheezing or rales.  Abdominal:     General: There is no distension.     Palpations: Abdomen is soft.     Tenderness: There is no abdominal tenderness.  Musculoskeletal:        General: No tenderness.     Comments: Patient points to the  left CVA as the area of tenderness.  She has no palpable tenderness there is no pain to percussion no midline spinal tenderness.  Skin:    General: Skin is warm and dry.  Neurological:     Mental Status: She is alert and oriented to person, place, and time.  Psychiatric:        Behavior: Behavior normal.      ED Treatments / Results  Labs (all labs ordered are listed, but only abnormal results are displayed) Labs  Reviewed  URINALYSIS, ROUTINE W REFLEX MICROSCOPIC    EKG None  Radiology No results found.  Procedures Procedures (including critical care time)  Medications Ordered in ED Medications  oxyCODONE (Oxy IR/ROXICODONE) immediate release tablet 2.5 mg (2.5 mg Oral Given 05/07/19 1551)  diazepam (VALIUM) tablet 2 mg (2 mg Oral Given 05/07/19 1550)  ketorolac (TORADOL) injection 15 mg (15 mg Intramuscular Given 05/07/19 1552)     Initial Impression / Assessment and Plan / ED Course  I have reviewed the triage vital signs and the nursing notes.  Pertinent labs & imaging results that were available during my care of the patient were reviewed by me and considered in my medical decision making (see chart for details).        67 yo F with a chief complaint of left-sided low back pain.  This is a new problem for her.  Started spontaneously while she was shopping yesterday.  I am unable to reproduce it but she states her husband reproduces it at home.  She denies any urinary symptoms but had a possible UTI.  Will obtain a CT stone study to evaluate for renal disease.  Reformatted of the L-spine.  Patient CARE signed out to Dr. Francia Greaves please see his note for further details care.  As the patient was started on Macrobid I feel this is not a good choice for possible pyelonephritis.  Will change to Keflex.  The patients results and plan were reviewed and discussed.   Any x-rays performed were independently reviewed by myself.   Differential diagnosis were considered with the  presenting HPI.  Medications  oxyCODONE (Oxy IR/ROXICODONE) immediate release tablet 2.5 mg (2.5 mg Oral Given 05/07/19 1551)  diazepam (VALIUM) tablet 2 mg (2 mg Oral Given 05/07/19 1550)  ketorolac (TORADOL) injection 15 mg (15 mg Intramuscular Given 05/07/19 1552)    Vitals:   05/07/19 1508  BP: (!) 142/73  Pulse: 72  Resp: 18  Temp: 98.1 F (36.7 C)  TempSrc: Oral  SpO2: 98%    Final diagnoses:  Acute right-sided low back pain without sciatica    Admission/ observation were discussed with the admitting physician, patient and/or family and they are comfortable with the plan.    Final Clinical Impressions(s) / ED Diagnoses   Final diagnoses:  Acute right-sided low back pain without sciatica    ED Discharge Orders         Ordered    cephALEXin (KEFLEX) 500 MG capsule  4 times daily     05/07/19 [place], Dayleen Beske, DO 05/07/19 1610

## 2019-06-14 DIAGNOSIS — S61212A Laceration without foreign body of right middle finger without damage to nail, initial encounter: Secondary | ICD-10-CM | POA: Diagnosis not present

## 2019-07-01 DIAGNOSIS — Z800 Family history of malignant neoplasm of digestive organs: Secondary | ICD-10-CM | POA: Diagnosis not present

## 2019-07-01 DIAGNOSIS — Z1211 Encounter for screening for malignant neoplasm of colon: Secondary | ICD-10-CM | POA: Diagnosis not present

## 2019-07-01 DIAGNOSIS — K625 Hemorrhage of anus and rectum: Secondary | ICD-10-CM | POA: Diagnosis not present

## 2019-09-01 DIAGNOSIS — K635 Polyp of colon: Secondary | ICD-10-CM | POA: Diagnosis not present

## 2019-09-01 DIAGNOSIS — D125 Benign neoplasm of sigmoid colon: Secondary | ICD-10-CM | POA: Diagnosis not present

## 2019-09-01 DIAGNOSIS — Z800 Family history of malignant neoplasm of digestive organs: Secondary | ICD-10-CM | POA: Diagnosis not present

## 2019-09-01 DIAGNOSIS — Z1211 Encounter for screening for malignant neoplasm of colon: Secondary | ICD-10-CM | POA: Diagnosis not present

## 2019-09-24 DIAGNOSIS — M545 Low back pain: Secondary | ICD-10-CM | POA: Diagnosis not present

## 2019-09-28 DIAGNOSIS — Z23 Encounter for immunization: Secondary | ICD-10-CM | POA: Diagnosis not present

## 2019-10-01 DIAGNOSIS — M545 Low back pain: Secondary | ICD-10-CM | POA: Diagnosis not present

## 2019-10-08 DIAGNOSIS — M545 Low back pain: Secondary | ICD-10-CM | POA: Diagnosis not present

## 2019-12-07 DIAGNOSIS — F900 Attention-deficit hyperactivity disorder, predominantly inattentive type: Secondary | ICD-10-CM | POA: Diagnosis not present

## 2019-12-07 DIAGNOSIS — R7301 Impaired fasting glucose: Secondary | ICD-10-CM | POA: Diagnosis not present

## 2019-12-07 DIAGNOSIS — E559 Vitamin D deficiency, unspecified: Secondary | ICD-10-CM | POA: Diagnosis not present

## 2019-12-07 DIAGNOSIS — Z0000 Encounter for general adult medical examination without abnormal findings: Secondary | ICD-10-CM | POA: Diagnosis not present

## 2019-12-07 DIAGNOSIS — M8588 Other specified disorders of bone density and structure, other site: Secondary | ICD-10-CM | POA: Diagnosis not present

## 2019-12-07 DIAGNOSIS — E7800 Pure hypercholesterolemia, unspecified: Secondary | ICD-10-CM | POA: Diagnosis not present

## 2019-12-07 DIAGNOSIS — R05 Cough: Secondary | ICD-10-CM | POA: Diagnosis not present

## 2019-12-22 ENCOUNTER — Other Ambulatory Visit: Payer: Self-pay | Admitting: Family Medicine

## 2019-12-22 DIAGNOSIS — E7800 Pure hypercholesterolemia, unspecified: Secondary | ICD-10-CM | POA: Diagnosis not present

## 2019-12-22 DIAGNOSIS — Z0000 Encounter for general adult medical examination without abnormal findings: Secondary | ICD-10-CM | POA: Diagnosis not present

## 2019-12-22 DIAGNOSIS — M8588 Other specified disorders of bone density and structure, other site: Secondary | ICD-10-CM | POA: Diagnosis not present

## 2019-12-22 DIAGNOSIS — R7301 Impaired fasting glucose: Secondary | ICD-10-CM | POA: Diagnosis not present

## 2019-12-22 DIAGNOSIS — Z1231 Encounter for screening mammogram for malignant neoplasm of breast: Secondary | ICD-10-CM

## 2019-12-22 DIAGNOSIS — Z13220 Encounter for screening for lipoid disorders: Secondary | ICD-10-CM | POA: Diagnosis not present

## 2019-12-22 DIAGNOSIS — E559 Vitamin D deficiency, unspecified: Secondary | ICD-10-CM | POA: Diagnosis not present

## 2019-12-29 ENCOUNTER — Other Ambulatory Visit: Payer: Self-pay | Admitting: Family Medicine

## 2019-12-29 DIAGNOSIS — E2839 Other primary ovarian failure: Secondary | ICD-10-CM

## 2020-02-03 ENCOUNTER — Ambulatory Visit: Payer: PPO

## 2020-02-25 ENCOUNTER — Ambulatory Visit: Payer: PPO | Attending: Internal Medicine

## 2020-02-25 DIAGNOSIS — Z23 Encounter for immunization: Secondary | ICD-10-CM

## 2020-03-16 DIAGNOSIS — E7800 Pure hypercholesterolemia, unspecified: Secondary | ICD-10-CM | POA: Diagnosis not present

## 2020-03-28 ENCOUNTER — Ambulatory Visit: Payer: PPO | Attending: Internal Medicine

## 2020-03-28 DIAGNOSIS — Z23 Encounter for immunization: Secondary | ICD-10-CM

## 2020-03-28 NOTE — Progress Notes (Signed)
   Covid-19 Vaccination Clinic  Name:  Nichole [name]    MRN: [id number] DOB: [birth year]  03/28/2020  [name] was observed post Covid-19 immunization for 15 minutes without incident. She was provided with Vaccine Information Sheet and instruction to access the V-Safe system.   [name] was instructed to call 911 with any severe reactions post vaccine: Marland Kitchen Difficulty breathing  . Swelling of face and throat  . A fast heartbeat  . A bad rash all over body  . Dizziness and weakness   Immunizations Administered    Name Date Dose VIS Date Route   Pfizer COVID-19 Vaccine 03/28/2020  1:00 PM 0.3 mL 12/03/2019 Intramuscular   Manufacturer: [place]   Lot: [id number]   [place]: [id number]

## 2020-06-08 DIAGNOSIS — I8392 Asymptomatic varicose veins of left lower extremity: Secondary | ICD-10-CM | POA: Diagnosis not present

## 2020-06-08 DIAGNOSIS — D2271 Melanocytic nevi of right lower limb, including hip: Secondary | ICD-10-CM | POA: Diagnosis not present

## 2020-06-08 DIAGNOSIS — D1801 Hemangioma of skin and subcutaneous tissue: Secondary | ICD-10-CM | POA: Diagnosis not present

## 2020-06-08 DIAGNOSIS — D2272 Melanocytic nevi of left lower limb, including hip: Secondary | ICD-10-CM | POA: Diagnosis not present

## 2020-06-08 DIAGNOSIS — D225 Melanocytic nevi of trunk: Secondary | ICD-10-CM | POA: Diagnosis not present

## 2020-06-08 DIAGNOSIS — L821 Other seborrheic keratosis: Secondary | ICD-10-CM | POA: Diagnosis not present

## 2020-06-08 DIAGNOSIS — L918 Other hypertrophic disorders of the skin: Secondary | ICD-10-CM | POA: Diagnosis not present

## 2020-06-30 DIAGNOSIS — Z03818 Encounter for observation for suspected exposure to other biological agents ruled out: Secondary | ICD-10-CM | POA: Diagnosis not present

## 2020-06-30 DIAGNOSIS — Z20822 Contact with and (suspected) exposure to covid-19: Secondary | ICD-10-CM | POA: Diagnosis not present

## 2020-07-10 ENCOUNTER — Other Ambulatory Visit: Payer: PPO

## 2020-07-10 ENCOUNTER — Ambulatory Visit: Payer: PPO

## 2020-07-24 ENCOUNTER — Other Ambulatory Visit: Payer: Self-pay

## 2020-07-24 ENCOUNTER — Ambulatory Visit
Admission: RE | Admit: 2020-07-24 | Discharge: 2020-07-24 | Disposition: A | Discharge status: Home / Self Care | Payer: PPO | Source: Ambulatory Visit | Attending: Family Medicine | Admitting: Family Medicine

## 2020-07-24 DIAGNOSIS — Z1231 Encounter for screening mammogram for malignant neoplasm of breast: Secondary | ICD-10-CM | POA: Diagnosis not present

## 2020-07-24 DIAGNOSIS — Z780 Asymptomatic menopausal state: Secondary | ICD-10-CM | POA: Diagnosis not present

## 2020-07-24 DIAGNOSIS — M85852 Other specified disorders of bone density and structure, left thigh: Secondary | ICD-10-CM | POA: Diagnosis not present

## 2020-07-24 DIAGNOSIS — E2839 Other primary ovarian failure: Secondary | ICD-10-CM

## 2020-10-09 DIAGNOSIS — F900 Attention-deficit hyperactivity disorder, predominantly inattentive type: Secondary | ICD-10-CM | POA: Diagnosis not present

## 2020-10-09 DIAGNOSIS — Z23 Encounter for immunization: Secondary | ICD-10-CM | POA: Diagnosis not present

## 2020-10-09 DIAGNOSIS — N898 Other specified noninflammatory disorders of vagina: Secondary | ICD-10-CM | POA: Diagnosis not present

## 2020-12-07 DIAGNOSIS — G4710 Hypersomnia, unspecified: Secondary | ICD-10-CM | POA: Diagnosis not present

## 2020-12-07 DIAGNOSIS — E559 Vitamin D deficiency, unspecified: Secondary | ICD-10-CM | POA: Diagnosis not present

## 2020-12-07 DIAGNOSIS — M8588 Other specified disorders of bone density and structure, other site: Secondary | ICD-10-CM | POA: Diagnosis not present

## 2020-12-07 DIAGNOSIS — F900 Attention-deficit hyperactivity disorder, predominantly inattentive type: Secondary | ICD-10-CM | POA: Diagnosis not present

## 2020-12-07 DIAGNOSIS — Z0000 Encounter for general adult medical examination without abnormal findings: Secondary | ICD-10-CM | POA: Diagnosis not present

## 2020-12-07 DIAGNOSIS — Z124 Encounter for screening for malignant neoplasm of cervix: Secondary | ICD-10-CM | POA: Diagnosis not present

## 2020-12-07 DIAGNOSIS — Z136 Encounter for screening for cardiovascular disorders: Secondary | ICD-10-CM | POA: Diagnosis not present

## 2021-05-10 DIAGNOSIS — H2513 Age-related nuclear cataract, bilateral: Secondary | ICD-10-CM | POA: Diagnosis not present

## 2021-05-10 DIAGNOSIS — G43109 Migraine with aura, not intractable, without status migrainosus: Secondary | ICD-10-CM | POA: Diagnosis not present

## 2021-06-15 DIAGNOSIS — G4710 Hypersomnia, unspecified: Secondary | ICD-10-CM | POA: Diagnosis not present

## 2021-06-15 DIAGNOSIS — E7800 Pure hypercholesterolemia, unspecified: Secondary | ICD-10-CM | POA: Diagnosis not present

## 2021-06-15 DIAGNOSIS — M8588 Other specified disorders of bone density and structure, other site: Secondary | ICD-10-CM | POA: Diagnosis not present

## 2021-06-15 DIAGNOSIS — E559 Vitamin D deficiency, unspecified: Secondary | ICD-10-CM | POA: Diagnosis not present

## 2021-06-15 DIAGNOSIS — F900 Attention-deficit hyperactivity disorder, predominantly inattentive type: Secondary | ICD-10-CM | POA: Diagnosis not present

## 2021-06-20 DIAGNOSIS — G4710 Hypersomnia, unspecified: Secondary | ICD-10-CM | POA: Diagnosis not present

## 2021-06-20 DIAGNOSIS — E559 Vitamin D deficiency, unspecified: Secondary | ICD-10-CM | POA: Diagnosis not present

## 2021-06-20 DIAGNOSIS — M8588 Other specified disorders of bone density and structure, other site: Secondary | ICD-10-CM | POA: Diagnosis not present

## 2021-06-20 DIAGNOSIS — F900 Attention-deficit hyperactivity disorder, predominantly inattentive type: Secondary | ICD-10-CM | POA: Diagnosis not present

## 2021-06-20 DIAGNOSIS — E7800 Pure hypercholesterolemia, unspecified: Secondary | ICD-10-CM | POA: Diagnosis not present

## 2021-09-06 ENCOUNTER — Other Ambulatory Visit: Payer: Self-pay | Admitting: Family Medicine

## 2021-09-06 DIAGNOSIS — Z1231 Encounter for screening mammogram for malignant neoplasm of breast: Secondary | ICD-10-CM

## 2021-10-04 DIAGNOSIS — G43909 Migraine, unspecified, not intractable, without status migrainosus: Secondary | ICD-10-CM | POA: Diagnosis not present

## 2021-10-04 DIAGNOSIS — H31003 Unspecified chorioretinal scars, bilateral: Secondary | ICD-10-CM | POA: Diagnosis not present

## 2021-10-04 DIAGNOSIS — H2513 Age-related nuclear cataract, bilateral: Secondary | ICD-10-CM | POA: Diagnosis not present

## 2021-10-04 DIAGNOSIS — H5203 Hypermetropia, bilateral: Secondary | ICD-10-CM | POA: Diagnosis not present

## 2021-10-08 ENCOUNTER — Ambulatory Visit
Admission: RE | Admit: 2021-10-08 | Discharge: 2021-10-08 | Disposition: A | Discharge status: Home / Self Care | Payer: PPO | Source: Ambulatory Visit

## 2021-10-08 ENCOUNTER — Other Ambulatory Visit: Payer: Self-pay

## 2021-10-08 DIAGNOSIS — Z1231 Encounter for screening mammogram for malignant neoplasm of breast: Secondary | ICD-10-CM

## 2021-10-24 DIAGNOSIS — G43909 Migraine, unspecified, not intractable, without status migrainosus: Secondary | ICD-10-CM | POA: Diagnosis not present

## 2021-11-09 DIAGNOSIS — L918 Other hypertrophic disorders of the skin: Secondary | ICD-10-CM | POA: Diagnosis not present

## 2021-11-09 DIAGNOSIS — D225 Melanocytic nevi of trunk: Secondary | ICD-10-CM | POA: Diagnosis not present

## 2021-11-09 DIAGNOSIS — D2272 Melanocytic nevi of left lower limb, including hip: Secondary | ICD-10-CM | POA: Diagnosis not present

## 2021-11-09 DIAGNOSIS — D2271 Melanocytic nevi of right lower limb, including hip: Secondary | ICD-10-CM | POA: Diagnosis not present

## 2021-11-09 DIAGNOSIS — L738 Other specified follicular disorders: Secondary | ICD-10-CM | POA: Diagnosis not present

## 2021-11-09 DIAGNOSIS — D2239 Melanocytic nevi of other parts of face: Secondary | ICD-10-CM | POA: Diagnosis not present

## 2021-11-09 DIAGNOSIS — L814 Other melanin hyperpigmentation: Secondary | ICD-10-CM | POA: Diagnosis not present

## 2021-11-09 DIAGNOSIS — L821 Other seborrheic keratosis: Secondary | ICD-10-CM | POA: Diagnosis not present

## 2021-12-05 DIAGNOSIS — R0789 Other chest pain: Secondary | ICD-10-CM | POA: Diagnosis not present

## 2021-12-05 DIAGNOSIS — F900 Attention-deficit hyperactivity disorder, predominantly inattentive type: Secondary | ICD-10-CM | POA: Diagnosis not present

## 2021-12-05 DIAGNOSIS — E7800 Pure hypercholesterolemia, unspecified: Secondary | ICD-10-CM | POA: Diagnosis not present

## 2021-12-05 DIAGNOSIS — E559 Vitamin D deficiency, unspecified: Secondary | ICD-10-CM | POA: Diagnosis not present

## 2021-12-30 NOTE — Progress Notes (Signed)
Cardiology Office Note:    Date:  01/04/2022   ID:  Nichole [name], DOB 1951-07-10, MRN [id number]  PCP:  Leighton Ruff, MD (Inactive)  Cardiologist:  Donato Heinz, MD  Electrophysiologist:  None   Referring MD: [name], PA-C   Chief Complaint  Patient presents with   Chest Pain    History of Present Illness:    Nichole [name] is a 71 y.o. female with a hx of hyperlipidemia, ADD, asthma who is referred by [name], PA for evaluation of chest pain.  She reports chest pain started about 6 months ago.  Occurs less than once per week.  Feels like pressure over chest.  Occurred off and on for 45 minutes during recent plane flight.  No clear relationship with exertion.  She plays tennis for exercise, denies exertional symptoms.  Does report dyspnea with exertion when she walks and plays tennis however.  Occasional lightheadedness, denies any syncope.  Denies any lower extremity edema or palpitations.  No smoking history.  No history of heart disease in her immediate family.   Past Medical History:  Diagnosis Date   ADD (attention deficit disorder)    Arthritis    Asthma    allergy induced asthma +   High cholesterol    Osteoarthritis    Pneumonia    hx of pneumonia     Past Surgical History:  Procedure Laterality Date   COLONOSCOPY     PARTIAL KNEE ARTHROPLASTY Left 11/07/2014   Procedure: UNICOMPARTMENTAL LEFT KNEE REPLACEMENT;  Surgeon: Gearlean Alf, MD;  Location: [facility];  Service: Orthopedics;  Laterality: Left;   TONSILLECTOMY     age 72    Current Medications: Current Meds  Medication Sig   albuterol (PROVENTIL HFA;VENTOLIN HFA) 108 (90 BASE) MCG/ACT inhaler Inhale 1-2 puffs into the lungs every 6 (six) hours as needed for wheezing or shortness of breath.   ibuprofen (ADVIL) 200 MG tablet 1 tablet with food or milk as needed   loratadine (CLARITIN) 10 MG tablet Take 10 mg by mouth every morning.    methocarbamol (ROBAXIN) 500 MG  tablet Take 1 tablet (500 mg total) by mouth every 6 (six) hours as needed for muscle spasms.   methylphenidate (RITALIN) 10 MG tablet Take 10 mg by mouth 3 (three) times daily with meals.   metoprolol tartrate (LOPRESSOR) 50 MG tablet Take 2 hours before CT scan     Allergies:   Sulfa antibiotics   Social History   Socioeconomic History   Marital status: Married    Spouse name: [name]   Number of children: 2   Years of education: BS   Highest education level: Not on file  Occupational History    Employer: [employer]    Comment: self employed  Tobacco Use   Smoking status: Never   Smokeless tobacco: Never  Substance and Sexual Activity   Alcohol use: No   Drug use: No   Sexual activity: Not on file  Other Topics Concern   Not on file  Social History Narrative   Patient is right handed and resides with husband, consumes 2-3 cups of caffeine daily   Social Determinants of Health   Financial Resource Strain: Not on file  Food Insecurity: Not on file  Transportation Needs: Not on file  Physical Activity: Not on file  Stress: Not on file  Social Connections: Not on file     Family History: The patient's family history includes COPD in her father; Cancer in  her maternal grandmother and mother; Cancer - Colon in her brother and father; Dementia in her maternal grandfather and mother; Fibromyalgia in her sister; Hepatitis C in her brother; Neuropathy (age of onset: 55) in her son.  ROS:   Please see the history of present illness.     All other systems reviewed and are negative.  EKGs/Labs/Other Studies Reviewed:    The following studies were reviewed today:   EKG:  EKG is ordered today.  The ekg ordered today demonstrates NSR, rate 65, no ST abnormalities  Recent Labs: 01/01/2022: BUN 18; Creatinine, Ser 0.88; Potassium 4.6; Sodium 138  Recent Lipid Panel No results found for: CHOL, TRIG, HDL, CHOLHDL, VLDL, LDLCALC, LDLDIRECT  Physical Exam:    VS:  BP 120/72     Pulse 65    Ht 5\' 6"  (1.676 m)    Wt 146 lb 6.4 oz (66.4 kg)    SpO2 98%    BMI 23.63 kg/m     Wt Readings from Last 3 Encounters:  01/01/22 146 lb 6.4 oz (66.4 kg)  05/07/19 145 lb (65.8 kg)  11/07/14 144 lb (65.3 kg)     GEN:  Well nourished, well developed in no acute distress HEENT: Normal NECK: No JVD; No carotid bruits LYMPHATICS: No lymphadenopathy CARDIAC: RRR, no murmurs, rubs, gallops RESPIRATORY:  Clear to auscultation without rales, wheezing or rhonchi  ABDOMEN: Soft, non-tender, non-distended MUSCULOSKELETAL:  No edema; No deformity  SKIN: Warm and dry NEUROLOGIC:  Alert and oriented x 3 PSYCHIATRIC:  Normal affect   ASSESSMENT:    1. Precordial pain   2. DOE (dyspnea on exertion)   3. Medication management   4. Hyperlipidemia, unspecified hyperlipidemia type    PLAN:    Chest pain/DOE: Atypical chest pain by description but does have CAD risk factors (age, hyperlipidemia) and also having dyspnea with exertion which could represent anginal equivalent.  Recommend coronary CTA to rule out obstructive CAD.  Will check echocardiogram to rule out structural heart disease  Hyperlipidemia: LDL 146 on 06/20/2021.  Not on statin.  Will follow-up results of coronary CTA to guide her aggressively lowering cholesterol   RTC in 6 months  Medication Adjustments/Labs and Tests Ordered: Current medicines are reviewed at length with the patient today.  Concerns regarding medicines are outlined above.  Orders Placed This Encounter  Procedures   CT CORONARY MORPH W/CTA COR W/SCORE W/CA W/CM &/OR WO/CM   Basic Metabolic Panel (BMET)   EKG 12-Lead   ECHOCARDIOGRAM COMPLETE   Meds ordered this encounter  Medications   metoprolol tartrate (LOPRESSOR) 50 MG tablet    Sig: Take 2 hours before CT scan    Dispense:  1 tablet    Refill:  0    Patient Instructions  Medication Instructions:  Your physician recommends that you continue on your current medications as directed.  Please refer to the Current Medication list given to you today.  *If you need a refill on your cardiac medications before your next appointment, please call your pharmacy*   Lab Work: Your physician recommends that you return for lab work in:  TODAY: BMET If you have labs (blood work) drawn today and your tests are completely normal, you will receive your results only by: [place] (if you have [place]) OR A paper copy in the mail If you have any lab test that is abnormal or we need to change your treatment, we will call you to review the results.   Testing/Procedures:   Your cardiac  CT will be scheduled at one of the below locations:   [facility] [address] [place], [place] [address] [phone]   If scheduled at [facility], please arrive at the [facility] main entrance (entrance A) of [facility] 30 minutes prior to test start time. You can use the FREE valet parking offered at the main entrance (encouraged to control the heart rate for the test) Proceed to the [facility] Radiology Department (first floor) to check-in and test prep.   Please follow these instructions carefully (unless otherwise directed):   On the Night Before the Test: Be sure to Drink plenty of water. Do not consume any caffeinated/decaffeinated beverages or chocolate 12 hours prior to your test. Do not take any antihistamines 12 hours prior to your test.  On the Day of the Test: Drink plenty of water until 1 hour prior to the test. Do not eat any food 4 hours prior to the test. Take metoprolol (Lopressor) two hours prior to test. HOLD Ritalin morning of the test. FEMALES- please wear underwire-free bra if available, avoid dresses & tight clothing  After the Test: Drink plenty of water. After receiving IV contrast, you may experience a mild flushed feeling. This is normal. On occasion, you may experience a mild rash up to 24 hours after the test. This is  not dangerous. If this occurs, you can take Benadryl 25 mg and increase your fluid intake. If you experience trouble breathing, this can be serious. If it is severe call 911 IMMEDIATELY. If it is mild, please call our office. If you take any of these medications: Glipizide/Metformin, Avandament, Glucavance, please do not take 48 hours after completing test unless otherwise instructed.  Please allow 2-4 weeks for scheduling of routine cardiac CTs. Some insurance companies require a pre-authorization which may delay scheduling of this test.   For non-scheduling related questions, please contact the cardiac imaging nurse navigator should you have any questions/concerns: Marchia Bond, Cardiac Imaging Nurse Navigator Gordy Clement, Cardiac Imaging Nurse Navigator [facility] Direct Office Dial: [phone]   For scheduling needs, including cancellations and rescheduling, please call [place], [phone].  Your physician has requested that you have an echocardiogram. Echocardiography is a painless test that uses sound waves to create images of your heart. It provides your doctor with information about the size and shape of your heart and how well your hearts chambers and valves are working. This procedure takes approximately one hour. There are no restrictions for this procedure   Follow-Up: At [facility], you and your health needs are our priority.  As part of our continuing mission to provide you with exceptional heart care, we have created designated Provider Care Teams.  These Care Teams include your primary Cardiologist (physician) and Advanced Practice Providers (APPs -  Physician Assistants and Nurse Practitioners) who all work together to provide you with the care you need, when you need it.  We recommend signing up for the patient portal called "MyChart".  Sign up information is provided on this After Visit Summary.  MyChart is used to connect with patients  for Virtual Visits (Telemedicine).  Patients are able to view lab/test results, encounter notes, upcoming appointments, etc.  Non-urgent messages can be sent to your provider as well.   To learn more about what you can do with MyChart, go to [email or web address].    Your next appointment:   6 month(s)  The format for your next appointment:   In  Person  Provider:   Donato Heinz, MD     Other Instructions Echocardiogram An echocardiogram is a test that uses sound waves (ultrasound) to produce images of the heart. Images from an echocardiogram can provide important information about: Heart size and shape. The size and thickness and movement of your heart's walls. Heart muscle function and strength. Heart valve function or if you have stenosis. Stenosis is when the heart valves are too narrow. If blood is flowing backward through the heart valves (regurgitation). A tumor or infectious growth around the heart valves. Areas of heart muscle that are not working well because of poor blood flow or injury from a heart attack. Aneurysm detection. An aneurysm is a weak or damaged part of an artery wall. The wall bulges out from the normal force of blood pumping through the body. Tell a health care provider about: Any allergies you have. All medicines you are taking, including vitamins, herbs, eye drops, creams, and over-the-counter medicines. Any blood disorders you have. Any surgeries you have had. Any medical conditions you have. Whether you are pregnant or may be pregnant. What are the risks? Generally, this is a safe test. However, problems may occur, including an allergic reaction to dye (contrast) that may be used during the test. What happens before the test? No specific preparation is needed. You may eat and drink normally. What happens during the test?  You will take off your clothes from the waist up and put on a hospital gown. Electrodes or electrocardiogram  (ECG)patches may be placed on your chest. The electrodes or patches are then connected to a device that monitors your heart rate and rhythm. You will lie down on a table for an ultrasound exam. A gel will be applied to your chest to help sound waves pass through your skin. A handheld device, called a transducer, will be pressed against your chest and moved over your heart. The transducer produces sound waves that travel to your heart and bounce back (or "echo" back) to the transducer. These sound waves will be captured in real-time and changed into images of your heart that can be viewed on a video monitor. The images will be recorded on a computer and reviewed by your health care provider. You may be asked to change positions or hold your breath for a short time. This makes it easier to get different views or better views of your heart. In some cases, you may receive contrast through an IV in one of your veins. This can improve the quality of the pictures from your heart. The procedure may vary among health care providers and hospitals. What can I expect after the test? You may return to your normal, everyday life, including diet, activities, and medicines, unless your health care provider tells you not to do that. Follow these instructions at home: It is up to you to get the results of your test. Ask your health care provider, or the department that is doing the test, when your results will be ready. Keep all follow-up visits. This is important. Summary An echocardiogram is a test that uses sound waves (ultrasound) to produce images of the heart. Images from an echocardiogram can provide important information about the size and shape of your heart, heart muscle function, heart valve function, and other possible heart problems. You do not need to do anything to prepare before this test. You may eat and drink normally. After the echocardiogram is completed, you may return to your normal, everyday  life, unless your health care provider tells you not to do that. This information is not intended to replace advice given to you by your health care provider. Make sure you discuss any questions you have with your health care provider. Document Revised: 08/22/2021 Document Reviewed: 08/01/2020 Elsevier Patient Education  2022 [place], Donato Heinz, MD  01/04/2022 12:54 PM    [place]

## 2022-01-01 ENCOUNTER — Encounter: Payer: Self-pay | Admitting: Cardiology

## 2022-01-01 ENCOUNTER — Other Ambulatory Visit: Payer: Self-pay

## 2022-01-01 ENCOUNTER — Ambulatory Visit: Payer: PPO | Admitting: Cardiology

## 2022-01-01 VITALS — BP 120/72 | HR 65 | Ht 66.0 in | Wt 146.4 lb

## 2022-01-01 DIAGNOSIS — R0789 Other chest pain: Secondary | ICD-10-CM

## 2022-01-01 DIAGNOSIS — R0609 Other forms of dyspnea: Secondary | ICD-10-CM | POA: Diagnosis not present

## 2022-01-01 DIAGNOSIS — Z79899 Other long term (current) drug therapy: Secondary | ICD-10-CM | POA: Diagnosis not present

## 2022-01-01 DIAGNOSIS — E785 Hyperlipidemia, unspecified: Secondary | ICD-10-CM

## 2022-01-01 DIAGNOSIS — R072 Precordial pain: Secondary | ICD-10-CM | POA: Diagnosis not present

## 2022-01-01 LAB — BASIC METABOLIC PANEL
BUN/Creatinine Ratio: 20 (ref 12–28)
BUN: 18 mg/dL (ref 8–27)
CO2: 27 mmol/L (ref 20–29)
Calcium: 10 mg/dL (ref 8.7–10.3)
Chloride: 97 mmol/L (ref 96–106)
Creatinine, Ser: 0.88 mg/dL (ref 0.57–1.00)
Glucose: 96 mg/dL (ref 70–99)
Potassium: 4.6 mmol/L (ref 3.5–5.2)
Sodium: 138 mmol/L (ref 134–144)
eGFR: 70 mL/min/{1.73_m2} (ref >59)

## 2022-01-01 MED ORDER — METOPROLOL TARTRATE 50 MG PO TABS: ORAL_TABLET | ORAL | 0 refills | Status: DC

## 2022-01-01 NOTE — Patient Instructions (Addendum)
Medication Instructions:  Your physician recommends that you continue on your current medications as directed. Please refer to the Current Medication list given to you today.  *If you need a refill on your cardiac medications before your next appointment, please call your pharmacy*   Lab Work: Your physician recommends that you return for lab work in:  TODAY: BMET If you have labs (blood work) drawn today and your tests are completely normal, you will receive your results only by: [place] (if you have [place]) OR A paper copy in the mail If you have any lab test that is abnormal or we need to change your treatment, we will call you to review the results.   Testing/Procedures:   Your cardiac CT will be scheduled at one of the below locations:   [facility] [address] [place], [place] [address] [phone]   If scheduled at [facility], please arrive at the [facility] main entrance (entrance A) of [facility] 30 minutes prior to test start time. You can use the FREE valet parking offered at the main entrance (encouraged to control the heart rate for the test) Proceed to the [facility] Radiology Department (first floor) to check-in and test prep.   Please follow these instructions carefully (unless otherwise directed):   On the Night Before the Test: Be sure to Drink plenty of water. Do not consume any caffeinated/decaffeinated beverages or chocolate 12 hours prior to your test. Do not take any antihistamines 12 hours prior to your test.  On the Day of the Test: Drink plenty of water until 1 hour prior to the test. Do not eat any food 4 hours prior to the test. Take metoprolol (Lopressor) two hours prior to test. HOLD Ritalin morning of the test. FEMALES- please wear underwire-free bra if available, avoid dresses & tight clothing  After the Test: Drink plenty of water. After receiving IV contrast, you may experience a mild  flushed feeling. This is normal. On occasion, you may experience a mild rash up to 24 hours after the test. This is not dangerous. If this occurs, you can take Benadryl 25 mg and increase your fluid intake. If you experience trouble breathing, this can be serious. If it is severe call 911 IMMEDIATELY. If it is mild, please call our office. If you take any of these medications: Glipizide/Metformin, Avandament, Glucavance, please do not take 48 hours after completing test unless otherwise instructed.  Please allow 2-4 weeks for scheduling of routine cardiac CTs. Some insurance companies require a pre-authorization which may delay scheduling of this test.   For non-scheduling related questions, please contact the cardiac imaging nurse navigator should you have any questions/concerns: Marchia Bond, Cardiac Imaging Nurse Navigator Gordy Clement, Cardiac Imaging Nurse Navigator [facility] Direct Office Dial: [phone]   For scheduling needs, including cancellations and rescheduling, please call [place], [phone].  Your physician has requested that you have an echocardiogram. Echocardiography is a painless test that uses sound waves to create images of your heart. It provides your doctor with information about the size and shape of your heart and how well your hearts chambers and valves are working. This procedure takes approximately one hour. There are no restrictions for this procedure   Follow-Up: At [facility], you and your health needs are our priority.  As part of our continuing mission to provide you with exceptional heart care, we have created designated Provider Care Teams.  These Care Teams include  your primary Cardiologist (physician) and Advanced Practice Providers (APPs -  Physician Assistants and Nurse Practitioners) who all work together to provide you with the care you need, when you need it.  We recommend signing up for the patient portal  called "MyChart".  Sign up information is provided on this After Visit Summary.  MyChart is used to connect with patients for Virtual Visits (Telemedicine).  Patients are able to view lab/test results, encounter notes, upcoming appointments, etc.  Non-urgent messages can be sent to your provider as well.   To learn more about what you can do with MyChart, go to [email or web address].    Your next appointment:   6 month(s)  The format for your next appointment:   In Person  Provider:   Donato Heinz, MD     Other Instructions Echocardiogram An echocardiogram is a test that uses sound waves (ultrasound) to produce images of the heart. Images from an echocardiogram can provide important information about: Heart size and shape. The size and thickness and movement of your heart's walls. Heart muscle function and strength. Heart valve function or if you have stenosis. Stenosis is when the heart valves are too narrow. If blood is flowing backward through the heart valves (regurgitation). A tumor or infectious growth around the heart valves. Areas of heart muscle that are not working well because of poor blood flow or injury from a heart attack. Aneurysm detection. An aneurysm is a weak or damaged part of an artery wall. The wall bulges out from the normal force of blood pumping through the body. Tell a health care provider about: Any allergies you have. All medicines you are taking, including vitamins, herbs, eye drops, creams, and over-the-counter medicines. Any blood disorders you have. Any surgeries you have had. Any medical conditions you have. Whether you are pregnant or may be pregnant. What are the risks? Generally, this is a safe test. However, problems may occur, including an allergic reaction to dye (contrast) that may be used during the test. What happens before the test? No specific preparation is needed. You may eat and drink normally. What happens during the  test?  You will take off your clothes from the waist up and put on a hospital gown. Electrodes or electrocardiogram (ECG)patches may be placed on your chest. The electrodes or patches are then connected to a device that monitors your heart rate and rhythm. You will lie down on a table for an ultrasound exam. A gel will be applied to your chest to help sound waves pass through your skin. A handheld device, called a transducer, will be pressed against your chest and moved over your heart. The transducer produces sound waves that travel to your heart and bounce back (or "echo" back) to the transducer. These sound waves will be captured in real-time and changed into images of your heart that can be viewed on a video monitor. The images will be recorded on a computer and reviewed by your health care provider. You may be asked to change positions or hold your breath for a short time. This makes it easier to get different views or better views of your heart. In some cases, you may receive contrast through an IV in one of your veins. This can improve the quality of the pictures from your heart. The procedure may vary among health care providers and hospitals. What can I expect after the test? You may return to your normal, everyday life, including diet, activities, and medicines, unless your health  care provider tells you not to do that. Follow these instructions at home: It is up to you to get the results of your test. Ask your health care provider, or the department that is doing the test, when your results will be ready. Keep all follow-up visits. This is important. Summary An echocardiogram is a test that uses sound waves (ultrasound) to produce images of the heart. Images from an echocardiogram can provide important information about the size and shape of your heart, heart muscle function, heart valve function, and other possible heart problems. You do not need to do anything to prepare before this  test. You may eat and drink normally. After the echocardiogram is completed, you may return to your normal, everyday life, unless your health care provider tells you not to do that. This information is not intended to replace advice given to you by your health care provider. Make sure you discuss any questions you have with your health care provider. Document Revised: 08/22/2021 Document Reviewed: 08/01/2020 Elsevier Patient Education  2022 [employer].

## 2022-01-02 NOTE — Telephone Encounter (Signed)
Spoke to patient. Informed her [facility] has a pre-cert department- which contact insurance for authorization . RN  informed patient will reach out to the team and to make sure precerts were obtained Will contact her back

## 2022-01-02 NOTE — Telephone Encounter (Signed)
RN  confirmed with pre cert department . Neither test require precert. RN called patient to inform her of the response. Patient voice understanding

## 2022-01-03 ENCOUNTER — Ambulatory Visit (INDEPENDENT_AMBULATORY_CARE_PROVIDER_SITE_OTHER): Payer: PPO

## 2022-01-03 ENCOUNTER — Other Ambulatory Visit: Payer: Self-pay

## 2022-01-03 DIAGNOSIS — R0789 Other chest pain: Secondary | ICD-10-CM

## 2022-01-03 LAB — ECHOCARDIOGRAM COMPLETE
AR max vel: 1.45 cm2
AV Area VTI: 1.37 cm2
AV Area mean vel: 1.3 cm2
AV Mean grad: 3 mmHg
AV Peak grad: 6.5 mmHg
Ao pk vel: 1.27 m/s
Area-P 1/2: 2.32 cm2
Calc EF: 68.7 %
S' Lateral: 2.38 cm
Single Plane A2C EF: 72.4 %
Single Plane A4C EF: 65.9 %

## 2022-01-07 ENCOUNTER — Other Ambulatory Visit (HOSPITAL_BASED_OUTPATIENT_CLINIC_OR_DEPARTMENT_OTHER): Payer: PPO

## 2022-01-09 ENCOUNTER — Telehealth (HOSPITAL_COMMUNITY): Payer: Self-pay | Admitting: *Deleted

## 2022-01-09 DIAGNOSIS — G4710 Hypersomnia, unspecified: Secondary | ICD-10-CM | POA: Diagnosis not present

## 2022-01-09 DIAGNOSIS — M8588 Other specified disorders of bone density and structure, other site: Secondary | ICD-10-CM | POA: Diagnosis not present

## 2022-01-09 DIAGNOSIS — E559 Vitamin D deficiency, unspecified: Secondary | ICD-10-CM | POA: Diagnosis not present

## 2022-01-09 DIAGNOSIS — Z23 Encounter for immunization: Secondary | ICD-10-CM | POA: Diagnosis not present

## 2022-01-09 DIAGNOSIS — E7800 Pure hypercholesterolemia, unspecified: Secondary | ICD-10-CM | POA: Diagnosis not present

## 2022-01-09 DIAGNOSIS — Z0000 Encounter for general adult medical examination without abnormal findings: Secondary | ICD-10-CM | POA: Diagnosis not present

## 2022-01-09 DIAGNOSIS — Z1389 Encounter for screening for other disorder: Secondary | ICD-10-CM | POA: Diagnosis not present

## 2022-01-09 NOTE — Telephone Encounter (Signed)
Attempted to call patient regarding upcoming cardiac CT appointment. °Left message on voicemail with name and callback number ° °Kaytelyn Glore RN Navigator Cardiac Imaging °[facility] °[phone] Office °[phone] Cell ° °

## 2022-01-09 NOTE — Telephone Encounter (Signed)
Patient returning call regarding upcoming cardiac imaging study; pt verbalizes understanding of appt date/time, parking situation and where to check in, pre-test NPO status and medications ordered, and verified current allergies; name and call back number provided for further questions should they arise  Nichole Clement RN Navigator Cardiac Imaging [facility] [phone] office [phone] cell  Patient to take 50mg  metoprolol tartrate two hours prior to cardiac CT. She is aware to arrive at 8:30am for her 9am scan.

## 2022-01-10 ENCOUNTER — Ambulatory Visit (HOSPITAL_COMMUNITY)
Admission: RE | Admit: 2022-01-10 | Discharge: 2022-01-10 | Disposition: A | Discharge status: Home / Self Care | Payer: PPO | Source: Ambulatory Visit | Attending: Cardiology | Admitting: Cardiology

## 2022-01-10 ENCOUNTER — Encounter: Payer: Self-pay | Admitting: Cardiology

## 2022-01-10 ENCOUNTER — Other Ambulatory Visit: Payer: Self-pay

## 2022-01-10 DIAGNOSIS — R072 Precordial pain: Secondary | ICD-10-CM | POA: Insufficient documentation

## 2022-01-10 MED ORDER — IOHEXOL 350 MG/ML SOLN
95.0000 mL | Freq: Once | INTRAVENOUS | Status: AC | PRN
  Administered 2022-01-10: 95 mL via INTRAVENOUS

## 2022-01-10 MED ORDER — NITROGLYCERIN 0.4 MG SL SUBL
0.8000 mg | SUBLINGUAL_TABLET | Freq: Once | SUBLINGUAL | Status: AC
  Administered 2022-01-10: 0.8 mg via SUBLINGUAL

## 2022-01-10 MED ORDER — NITROGLYCERIN 0.4 MG SL SUBL
SUBLINGUAL_TABLET | SUBLINGUAL | Status: AC
  Filled 2022-01-10: qty 2

## 2022-01-11 ENCOUNTER — Other Ambulatory Visit: Payer: Self-pay

## 2022-01-11 ENCOUNTER — Telehealth: Payer: Self-pay | Admitting: Cardiology

## 2022-01-11 NOTE — Telephone Encounter (Signed)
Patient is calling because on MyChart is stated cardiac cath recommended.  She is wondering if that is something she needs follow up. You can leave message on her VM.

## 2022-01-11 NOTE — Telephone Encounter (Signed)
Left message on patient's private voicemail that a cardiac cath is not indicated, and that she can contact clinic with any other questions/concerns.

## 2022-01-14 ENCOUNTER — Encounter: Payer: Self-pay | Admitting: Cardiology

## 2022-01-16 DIAGNOSIS — E7800 Pure hypercholesterolemia, unspecified: Secondary | ICD-10-CM | POA: Diagnosis not present

## 2022-04-17 DIAGNOSIS — L820 Inflamed seborrheic keratosis: Secondary | ICD-10-CM | POA: Diagnosis not present

## 2022-05-09 IMAGING — CT CT HEART MORP W/ CTA COR W/ SCORE W/ CA W/CM &/OR W/O CM
4 of 7 series · 8 of 20 positions shown, 9 images · IV contrast (APPLIED)
Comparison: Chest radiograph 02/09/2016
COMPARISON: Chest radiograph 02/09/2016

Addendum:
EXAM:
OVER-READ INTERPRETATION  CT CHEST

The following report is an over-read performed by radiologist Dr.
Pernell Baumgartner [REDACTED] on 01/10/2022. This over-read
does not include interpretation of cardiac or coronary anatomy or
pathology. The coronary CTA interpretation by the cardiologist is
attached.
HISTORY: Chest pain, nonspecific
Cardiac/Coronary CT
TECHNIQUE: The patient was scanned on a Siemens Force scanner.
PROTOCOL: A 120 kV prospective scan was triggered in the descending thoracic
aorta at 111 HU's. Axial non-contrast 3 mm slices were carried out
through the heart. The data set was analyzed on a dedicated work
station and scored using the Agatson method. Gantry rotation speed
was 250 msecs and collimation was 0.6 mm. Heart rate optimized
medically, and 0.8 mg of sublingual nitroglycerin was given. The 3D
data set was reconstructed in 5% intervals of 35-75% of the R-R
cycle. Diastolic phases were analyzed on a dedicated work station
using MPR, MIP and VRT modes. The patient received 95mL OMNIPAQUE
IOHEXOL 350 MG/ML SOLN of contrast.

[Series 6: ts diast sharp · axial · 0.39mm/px · z∈[-246,-207]mm · 2 of 291 slices shown]
[im 97/291  lung]
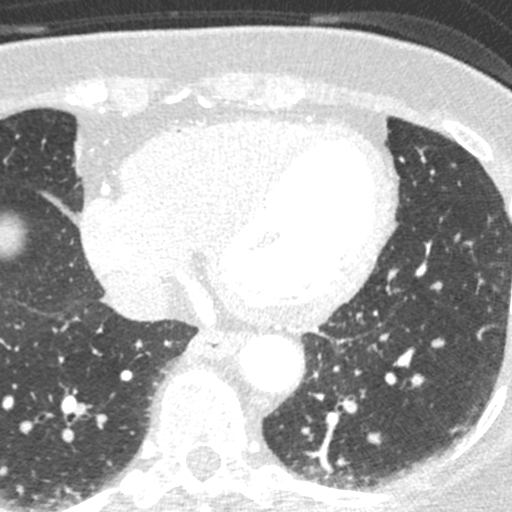
[im 194/291  lung]
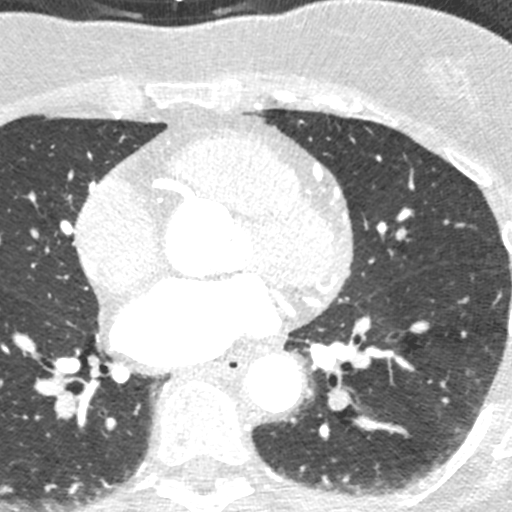

[Series 7: ts syst sharp · axial · 0.39mm/px · z∈[-246,-207]mm · 2 of 291 slices shown]
[im 97/291  lung]
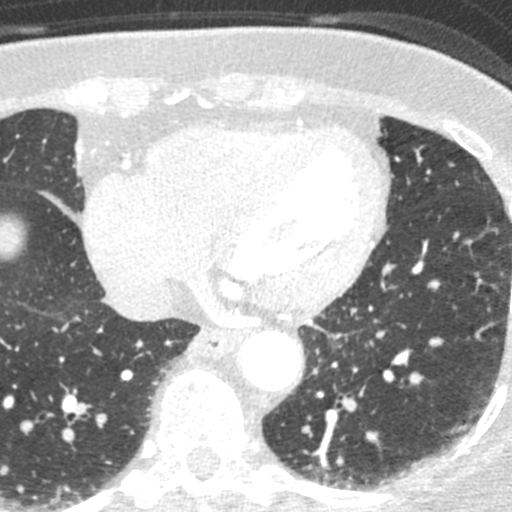
[im 194/291  lung]
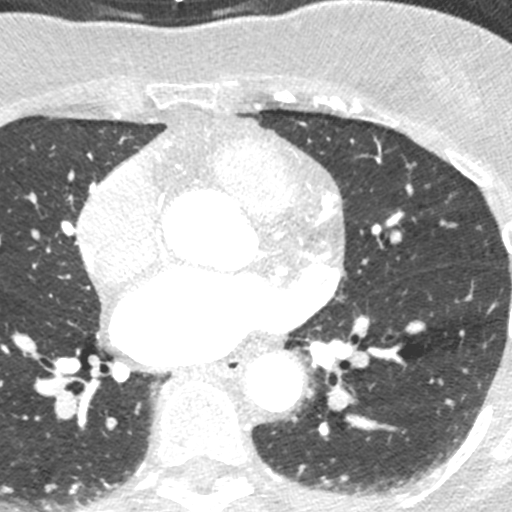

[Series 8: best syst · axial · 0.39mm/px · z∈[-246,-207]mm · 2 of 291 slices shown, 3 images]
[im 97/291  vessel]
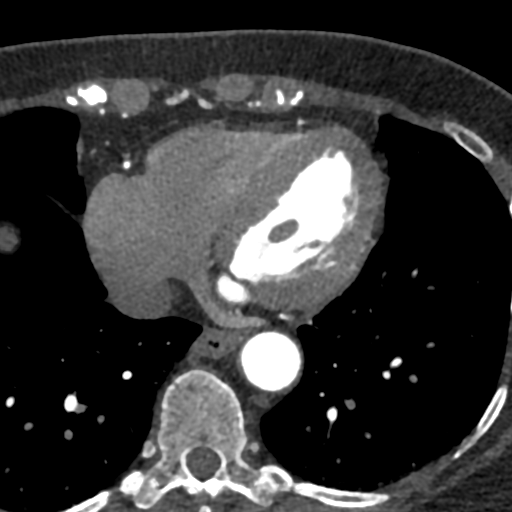
[im 97/291  lung]
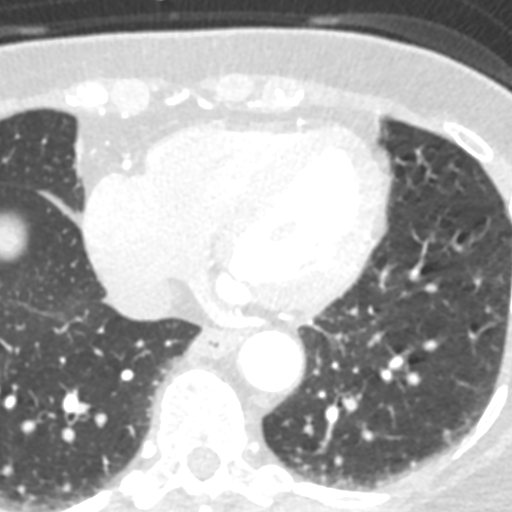
[im 194/291  vessel]
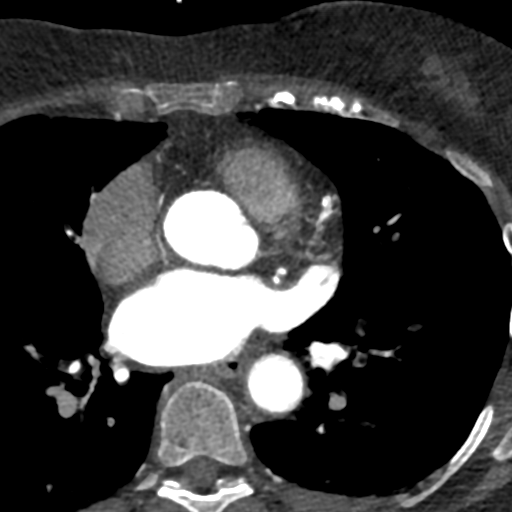

[Series 9: best diast · axial · 0.39mm/px · z∈[-246,-207]mm · 2 of 291 slices shown]
[im 97/291  vessel]
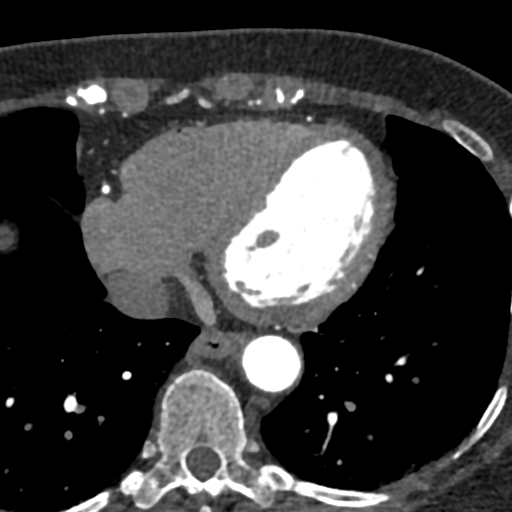
[im 194/291  vessel]
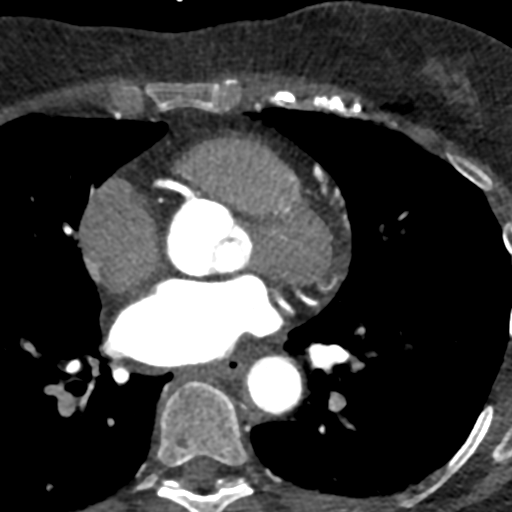

[8 of 20 positions shown; findings below may reference images not displayed]

FINDINGS: Vascular: Aortic atherosclerosis. No central pulmonary embolism, on
this non-dedicated study.

Mediastinum/Nodes: No imaged thoracic adenopathy. Small hiatal
hernia.

Lungs/Pleura: No pleural fluid. Anterior right lower lobe 3 mm
pulmonary nodule on 35/11.

Upper Abdomen: Normal imaged portions of the liver, spleen.

Musculoskeletal: No acute osseous abnormality.
IMPRESSION: 1.  No acute findings in the imaged extracardiac chest.
2.  Aortic Atherosclerosis (K7SYP-M1F.F).
3. Small hiatal hernia.
4. 4 mm right lower lobe pulmonary nodule. No follow-up needed if
patient is low-risk. Non-contrast chest CT can be considered in 12
months if patient is high-risk. This recommendation follows the
consensus statement: Guidelines for Management of Incidental
Pulmonary Nodules Detected on CT Images: From the [HOSPITAL]
FINDINGS: Coronary calcium score: The patient's coronary artery calcium score
is 58, which places the patient in the 60th percentile.

Coronary arteries: LAD and LCX have discrete ostia from coronary
sinus. Right dominance.

Right Coronary Artery: Normal caliber vessel, gives rise to PDA. Two
areas of focal calcified plaque in proximal RCA with 1-24% stenosis.

Left Main Coronary Artery: Not present as LAD and LCx have distinct,
separate ostia from the coronary sinus

Left Anterior Descending Coronary Artery: Normal caliber vessel.
There is focal calcified plaque in the proximal LAD with 1-24%
stenosis. There is step artifact in both the mid and distal LAD;
while no significant plaque/stenosis noted, focal stenosis in these
areas cannot be excluded. Gives rise to 2 diagonal branches.

Left Circumflex Artery: Normal caliber vessel. No significant plaque
or stenosis. Gives rise to 1 OM branches, OM1 large and branching.

Aorta: Normal size, 34 mm at the mid ascending aorta (level of the
PA bifurcation) measured double oblique. Scattered calcifications
consistent with aortic atherosclerosis. No dissection seen in
visualized portions of the aorta.

Aortic Valve: No calcifications. Trileaflet.

Other findings:

Normal pulmonary vein drainage into the left atrium.

Normal left atrial appendage without a thrombus.

Normal size of the pulmonary artery.

Normal appearance of the pericardium.
IMPRESSION: 1. Mild nonobstructive CAD, CADRADS = 1. There are two focal areas
of step artifact; limited interpretation at those areas.

2. Coronary calcium score of 58. This was 60th percentile for age
and sex matched control.

3. No left main, as LAD and LCX have separate ostia from the
coronary sinus. Right dominance.

INTERPRETATION:

1. CAD-RADS 0: No evidence of CAD (0%). Consider non-atherosclerotic
causes of chest pain.

2. CAD-RADS 1: Minimal non-obstructive CAD (0-24%). Consider
non-atherosclerotic causes of chest pain. Consider preventive
therapy and risk factor modification.

3. CAD-RADS 2: Mild non-obstructive CAD (25-49%). Consider
non-atherosclerotic causes of chest pain. Consider preventive
therapy and risk factor modification.

4. CAD-RADS 3: Moderate stenosis (50-69%). Consider symptom-guided
anti-ischemic pharmacotherapy as well as risk factor modification
per guideline directed care. Additional analysis with CT FFR will be
submitted.

5. CAD-RADS 4: Severe stenosis. (70-99% or > 50% left main). Cardiac
catheterization or CT FFR is recommended. Consider symptom-guided
anti-ischemic pharmacotherapy as well as risk factor modification
per guideline directed care. Invasive coronary angiography
recommended with revascularization per published guideline
statements.

6. CAD-RADS 5: Total coronary occlusion (100%). Consider cardiac
catheterization or viability assessment. Consider symptom-guided
anti-ischemic pharmacotherapy as well as risk factor modification
per guideline directed care.

7. CAD-RADS N: Non-diagnostic study. Obstructive CAD can't be
excluded. Alternative evaluation is recommended.

*** End of Addendum ***
EXAM:
OVER-READ INTERPRETATION  CT CHEST

The following report is an over-read performed by radiologist Dr.
Pernell Baumgartner [REDACTED] on 01/10/2022. This over-read
does not include interpretation of cardiac or coronary anatomy or
pathology. The coronary CTA interpretation by the cardiologist is
attached.
FINDINGS: Vascular: Aortic atherosclerosis. No central pulmonary embolism, on
this non-dedicated study.

Mediastinum/Nodes: No imaged thoracic adenopathy. Small hiatal
hernia.

Lungs/Pleura: No pleural fluid. Anterior right lower lobe 3 mm
pulmonary nodule on 35/11.

Upper Abdomen: Normal imaged portions of the liver, spleen.

Musculoskeletal: No acute osseous abnormality.
IMPRESSION: 1.  No acute findings in the imaged extracardiac chest.
2.  Aortic Atherosclerosis (K7SYP-M1F.F).
3. Small hiatal hernia.
4. 4 mm right lower lobe pulmonary nodule. No follow-up needed if
patient is low-risk. Non-contrast chest CT can be considered in 12
months if patient is high-risk. This recommendation follows the
consensus statement: Guidelines for Management of Incidental
Pulmonary Nodules Detected on CT Images: From the [HOSPITAL]

## 2022-05-27 DIAGNOSIS — R7309 Other abnormal glucose: Secondary | ICD-10-CM | POA: Diagnosis not present

## 2022-05-27 DIAGNOSIS — E7800 Pure hypercholesterolemia, unspecified: Secondary | ICD-10-CM | POA: Diagnosis not present

## 2022-05-27 DIAGNOSIS — R5383 Other fatigue: Secondary | ICD-10-CM | POA: Diagnosis not present

## 2022-06-04 ENCOUNTER — Ambulatory Visit: Payer: PPO | Admitting: Sports Medicine

## 2022-06-04 VITALS — BP 122/68 | Ht 65.0 in | Wt 145.0 lb

## 2022-06-04 DIAGNOSIS — M25559 Pain in unspecified hip: Secondary | ICD-10-CM | POA: Diagnosis not present

## 2022-06-04 DIAGNOSIS — M25551 Pain in right hip: Secondary | ICD-10-CM | POA: Diagnosis not present

## 2022-06-04 NOTE — Assessment & Plan Note (Signed)
We will plan HEP Conservative care Prn advil  Reck in 6 weeks and if Hip strength better would expect much less pain

## 2022-06-04 NOTE — Progress Notes (Signed)
PCP: Leighton Ruff, MD (Inactive)  Subjective:   HPI: Nichole [name] is a pleasant 71 y.o. female here for right hip pain.  Left knee TKA in 2015 by Dr. Maureen Ralphs - doing great from that. Over the last 3-4 months she has noticed insidious onset of right lateral hip pain.  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  She also has pain is worse with going up and down with steps.  The pain is a chronic ache and soreness over the lateral hip.  This does bother her at nighttime as well.  She denies any redness, swelling, fever or chills.  She denies any pain going into the groin.  She does take Advil before she plays tennis, but otherwise is not using any other medications.  She does try to stretch and be active.    Past Medical History:  Diagnosis Date   ADD (attention deficit disorder)    Arthritis    Asthma    allergy induced asthma +   High cholesterol    Osteoarthritis    Pneumonia    hx of pneumonia     Current Outpatient Medications on File Prior to Visit  Medication Sig Dispense Refill   albuterol (PROVENTIL HFA;VENTOLIN HFA) 108 (90 BASE) MCG/ACT inhaler Inhale 1-2 puffs into the lungs every 6 (six) hours as needed for wheezing or shortness of breath.     ibuprofen (ADVIL) 200 MG tablet 1 tablet with food or milk as needed     loratadine (CLARITIN) 10 MG tablet Take 10 mg by mouth every morning.      methocarbamol (ROBAXIN) 500 MG tablet Take 1 tablet (500 mg total) by mouth every 6 (six) hours as needed for muscle spasms. 80 tablet 0   methylphenidate (RITALIN) 10 MG tablet Take 10 mg by mouth 3 (three) times daily with meals.     metoprolol tartrate (LOPRESSOR) 50 MG tablet Take 2 hours before CT scan 1 tablet 0   No current facility-administered medications on file prior to visit.    Past Surgical History:  Procedure Laterality Date   COLONOSCOPY     PARTIAL KNEE ARTHROPLASTY Left 11/07/2014   Procedure: UNICOMPARTMENTAL LEFT KNEE REPLACEMENT;  Surgeon: Gearlean Alf, MD;  Location: [facility];  Service: Orthopedics;  Laterality: Left;   TONSILLECTOMY     age 64    Allergies  Allergen Reactions   Erythromycin     Other reaction(s): stomach upset   Oseltamivir     Other reaction(s): nausea, insomnia   Sulfa Antibiotics     Hives and swelling    There were no vitals taken for this visit.      No data to display              No data to display              Objective:  Physical Exam:  Gen: Well-appearing, in no acute distress; non-toxic CV: Regular Rate. Well-perfused. Warm.  Resp: Breathing unlabored on room air; no wheezing. Psych: Fluid speech in conversation; appropriate affect; normal thought process Neuro: Sensation intact throughout. No gross coordination deficits.  MSK:  - Right hip: There is some mild tenderness to palpation noted over the posterior lateral aspect of the greater trochanter, although not exquisitely tender compared to the contralateral hip.  No obvious erythema, ecchymosis or swelling.  Passive logroll is equivalent bilaterally without restriction.  Range of motion is full in all directions.  There is weakness in hip  abduction on the right compared to the left hip, otherwise strength 5/5 in all directions.  There is a nonantalgic gait.  She does lean the upper trunk to the contralateral side slightly, although no true Trendelenburg gait.  Negative FADIR test.  There is mild discomfort with FABER bilaterally.  Negative Thomas test.  Neurovascular intact distally.    Assessment & Plan:  1. Right greater trochanteric pain syndrome 2. Right hip abduction weakness  We discussed with Roselyn Reef the likely etiology of her condition.  She is very reassured that this is very unlikely hip osteoarthritis as she has great range of motion and negative provocative testing for hip OA.  She does have some weakness with hip abduction.  We will give her a home exercise series which we demonstrated and provided for the patient  today to work on the hip abductors and hip stabilization exercises.  She can continue to take Advil as needed.  We did recommend cushioning that side when she sleeps.  We will see her back in about 6 weeks to see what sort of progress she made.  If she is not improving, we may consider ultrasounding the hip and possible Nitropatch protocol or others depending on what ultrasound findings we see.  Follow-up in 6 weeks.  Elba Barman, DO PGY-4, Sports Medicine Fellow [place]  This note was dictated using Dragon naturally speaking software and may contain errors in syntax, spelling, or content which have not been identified prior to signing this note.   I observed and examined the patient with the [facility] resident and agree with assessment and plan.  Note reviewed and modified by me. Ila Mcgill, MD

## 2022-07-13 NOTE — Progress Notes (Unsigned)
Cardiology Office Note:    Date:  07/16/2022   ID:  Nichole [name], DOB 1951-06-25, MRN [id number]  PCP:  Leighton Ruff, MD (Inactive)  Cardiologist:  Donato Heinz, MD  Electrophysiologist:  None   Referring MD: No ref. provider found   Chief Complaint  Patient presents with   Coronary Artery Disease    History of Present Illness:    Nichole [name] is a 71 y.o. female with a hx of hyperlipidemia, ADD, asthma who presents for follow-up.  She was referred by [name], PA for evaluation of chest pain, initially seen on 01/01/2022.  She reports chest pain started about 6 months ago.  Occurs less than once per week.  Feels like pressure over chest.  Occurred off and on for 45 minutes during recent plane flight.  No clear relationship with exertion.  She plays tennis for exercise, denies exertional symptoms.  Does report dyspnea with exertion when she walks and plays tennis however.  Occasional lightheadedness, denies any syncope.  Denies any lower extremity edema or palpitations.  No smoking history.  No history of heart disease in her immediate family.  Echocardiogram 01/03/2022 showed normal biventricular function, grade 1 diastolic dysfunction, no significant valvular disease.  Coronary CTA on 01/10/2022 showed mild nonobstructive CAD (calcium score 58, 60th percentile).  Since last clinic visit, she reports she has had 1 episode of chest pain over the last 6 months.  Reports dyspnea only with significant activity.  Denies any lightheadedness, syncope, lower extremity edema, or palpitations.  Plays tennis 3 times per week and walks.     Past Medical History:  Diagnosis Date   ADD (attention deficit disorder)    Arthritis    Asthma    allergy induced asthma +   High cholesterol    Osteoarthritis    Pneumonia    hx of pneumonia     Past Surgical History:  Procedure Laterality Date   COLONOSCOPY     PARTIAL KNEE ARTHROPLASTY Left 11/07/2014   Procedure:  UNICOMPARTMENTAL LEFT KNEE REPLACEMENT;  Surgeon: Gearlean Alf, MD;  Location: [facility];  Service: Orthopedics;  Laterality: Left;   TONSILLECTOMY     age 54    Current Medications: Current Meds  Medication Sig   albuterol (PROVENTIL HFA;VENTOLIN HFA) 108 (90 BASE) MCG/ACT inhaler Inhale 1-2 puffs into the lungs every 6 (six) hours as needed for wheezing or shortness of breath.   Cholecalciferol (VITAMIN D) 50 MCG (2000 UT) CAPS 1 tablet   ibuprofen (ADVIL) 200 MG tablet 1 tablet with food or milk as needed   loratadine (CLARITIN) 10 MG tablet Take 10 mg by mouth every morning.    methylphenidate (RITALIN) 10 MG tablet Take 10 mg by mouth 3 (three) times daily with meals.   Red Yeast Rice 600 MG CAPS See admin instructions.   rosuvastatin (CRESTOR) 5 MG tablet Take 1 tablet (5 mg total) by mouth daily.   [DISCONTINUED] metoprolol tartrate (LOPRESSOR) 50 MG tablet Take 2 hours before CT scan     Allergies:   Erythromycin, Oseltamivir, and Sulfa antibiotics   Social History   Socioeconomic History   Marital status: Married    Spouse name: [name]   Number of children: 2   Years of education: BS   Highest education level: Not on file  Occupational History    Employer: [employer]    Comment: self employed  Tobacco Use   Smoking status: Never   Smokeless tobacco: Never  Substance and Sexual  Activity   Alcohol use: No   Drug use: No   Sexual activity: Not on file  Other Topics Concern   Not on file  Social History Narrative   Patient is right handed and resides with husband, consumes 2-3 cups of caffeine daily   Social Determinants of Health   Financial Resource Strain: Not on file  Food Insecurity: Not on file  Transportation Needs: Not on file  Physical Activity: Not on file  Stress: Not on file  Social Connections: Not on file     Family History: The patient's family history includes COPD in her father; Cancer in her maternal grandmother and mother; Cancer - Colon  in her brother and father; Dementia in her maternal grandfather and mother; Fibromyalgia in her sister; Hepatitis C in her brother; Neuropathy (age of onset: 71) in her son.  ROS:   Please see the history of present illness.     All other systems reviewed and are negative.  EKGs/Labs/Other Studies Reviewed:    The following studies were reviewed today:   EKG:  07/16/2022: Normal sinus rhythm, rate 71, poor R wave progression  Recent Labs: 01/01/2022: BUN 18; Creatinine, Ser 0.88; Potassium 4.6; Sodium 138  Recent Lipid Panel No results found for: "CHOL", "TRIG", "HDL", "CHOLHDL", "VLDL", "LDLCALC", "LDLDIRECT"  Physical Exam:    VS:  BP 121/75 (BP Location: Left Arm, Patient Position: Sitting, Cuff Size: Normal)   Pulse 70   Ht '5\' 5"'$  (1.651 m)   Wt 148 lb (67.1 kg)   SpO2 98%   BMI 24.63 kg/m     Wt Readings from Last 3 Encounters:  07/16/22 148 lb (67.1 kg)  07/16/22 145 lb (65.8 kg)  06/04/22 145 lb (65.8 kg)     GEN:  Well nourished, well developed in no acute distress HEENT: Normal NECK: No JVD; No carotid bruits LYMPHATICS: No lymphadenopathy CARDIAC: RRR, no murmurs, rubs, gallops RESPIRATORY:  Clear to auscultation without rales, wheezing or rhonchi  ABDOMEN: Soft, non-tender, non-distended MUSCULOSKELETAL:  No edema; No deformity  SKIN: Warm and dry NEUROLOGIC:  Alert and oriented x 3 PSYCHIATRIC:  Normal affect   ASSESSMENT:    1. CAD in native artery   2. DOE (dyspnea on exertion)   3. Hyperlipidemia, unspecified hyperlipidemia type     PLAN:    Chest pain/DOE: Atypical chest pain by description.  Echocardiogram 01/03/2022 showed normal biventricular function, grade 1 diastolic dysfunction, no significant valvular disease.  Coronary CTA on 01/10/2022 showed mild nonobstructive CAD (calcium score 58, 60th percentile).  Hyperlipidemia: LDL 146 on 06/20/2021.  Given coronary CTA results as above, recommended starting rosuvastatin but she was to hold off at  that time.  She has been taking red yeast rice.  LDL 110 on 05/27/2022.  She is agreeable to trying rosuvastatin, will start 5 mg daily.  Check fasting lipid panel in 2 months   RTC in 1 year  Medication Adjustments/Labs and Tests Ordered: Current medicines are reviewed at length with the patient today.  Concerns regarding medicines are outlined above.  Orders Placed This Encounter  Procedures   Lipid panel   EKG 12-Lead   Meds ordered this encounter  Medications   rosuvastatin (CRESTOR) 5 MG tablet    Sig: Take 1 tablet (5 mg total) by mouth daily.    Dispense:  90 tablet    Refill:  3    Patient Instructions  Medication Instructions:  START rosuvastatin (Crestor) 5 mg daily  *If you need a refill on  your cardiac medications before your next appointment, please call your pharmacy*   Lab Work: Please return for FASTING labs in 2 months (Lipid)  Our in office lab hours are Monday-Friday 8:00-4:00, closed for lunch 12:45-1:45 pm.  No appointment needed.  [facility] locations:   [place] ([place]) - [address] [place] [address] - [address] [place] - [address] ([facility])  Follow-Up: At [facility], you and your health needs are our priority.  As part of our continuing mission to provide you with exceptional heart care, we have created designated Provider Care Teams.  These Care Teams include your primary Cardiologist (physician) and Advanced Practice Providers (APPs -  Physician Assistants and Nurse Practitioners) who all work together to provide you with the care you need, when you need it.  We recommend signing up for the patient portal called "MyChart".  Sign up information  is provided on this After Visit Summary.  MyChart is used to connect with patients for Virtual Visits (Telemedicine).  Patients are able to view lab/test results, encounter notes, upcoming appointments, etc.  Non-urgent messages can be sent to your provider as well.   To learn more about what you can do with MyChart, go to [email or web address].    Your next appointment:   12 month(s)  The format for your next appointment:   In Person  Provider:   Donato Heinz, MD {    Important Information About Sugar         Signed, Donato Heinz, MD  07/16/2022 6:06 PM    [place]

## 2022-07-16 ENCOUNTER — Encounter: Payer: Self-pay | Admitting: Sports Medicine

## 2022-07-16 ENCOUNTER — Ambulatory Visit: Payer: PPO | Admitting: Cardiology

## 2022-07-16 ENCOUNTER — Encounter: Payer: Self-pay | Admitting: Cardiology

## 2022-07-16 ENCOUNTER — Ambulatory Visit: Payer: PPO | Admitting: Sports Medicine

## 2022-07-16 VITALS — BP 121/75 | HR 70 | Ht 65.0 in | Wt 148.0 lb

## 2022-07-16 DIAGNOSIS — R0609 Other forms of dyspnea: Secondary | ICD-10-CM

## 2022-07-16 DIAGNOSIS — M25559 Pain in unspecified hip: Secondary | ICD-10-CM | POA: Diagnosis not present

## 2022-07-16 DIAGNOSIS — E785 Hyperlipidemia, unspecified: Secondary | ICD-10-CM | POA: Diagnosis not present

## 2022-07-16 DIAGNOSIS — I2510 Atherosclerotic heart disease of native coronary artery without angina pectoris: Secondary | ICD-10-CM

## 2022-07-16 MED ORDER — ROSUVASTATIN CALCIUM 5 MG PO TABS: 5.0000 mg | ORAL_TABLET | Freq: Every day | ORAL | 3 refills | Status: DC

## 2022-07-16 NOTE — Patient Instructions (Signed)
Medication Instructions:  START rosuvastatin (Crestor) 5 mg daily  *If you need a refill on your cardiac medications before your next appointment, please call your pharmacy*   Lab Work: Please return for FASTING labs in 2 months (Lipid)  Our in office lab hours are Monday-Friday 8:00-4:00, closed for lunch 12:45-1:45 pm.  No appointment needed.  [facility] locations:   [place] ([place]) - [address] [place] [address] - [address] [place] - [address] ([facility])  Follow-Up: At [facility], you and your health needs are our priority.  As part of our continuing mission to provide you with exceptional heart care, we have created designated Provider Care Teams.  These Care Teams include your primary Cardiologist (physician) and Advanced Practice Providers (APPs -  Physician Assistants and Nurse Practitioners) who all work together to provide you with the care you need, when you need it.  We recommend signing up for the patient portal called "MyChart".  Sign up information is provided on this After Visit Summary.  MyChart is used to connect with patients for Virtual Visits (Telemedicine).  Patients are able to view lab/test results, encounter notes, upcoming appointments, etc.  Non-urgent messages can be sent to your provider as well.   To learn more about what you can do with MyChart, go to [email or web address].    Your next appointment:   12 month(s)  The format for your next appointment:   In Person  Provider:   Donato Heinz, MD {    Important Information About Sugar

## 2022-07-16 NOTE — Assessment & Plan Note (Signed)
She shows significant improvement of symptoms and of strength even though compliance with HEP has been about 50%. Suspect this will continue to resolve.

## 2022-07-16 NOTE — Progress Notes (Signed)
PCP: Leighton Ruff, MD (Inactive)  Subjective:   HPI: Patient is a 71 y.o. female here for 6 week follow up for right greater trochanteric pain.  She has been completing her home exercises approximately 50% of the time over the last 6 weeks and feels about 50% better. She takes ibuprofen when she plays tennis to help with the pain, but otherwise does not take any medications. The pain is still worse with going up and down stairs. It does bother her at nighttime as she sleeps on her side.    Past Medical History:  Diagnosis Date   ADD (attention deficit disorder)    Arthritis    Asthma    allergy induced asthma +   High cholesterol    Osteoarthritis    Pneumonia    hx of pneumonia     Current Outpatient Medications on File Prior to Visit  Medication Sig Dispense Refill   albuterol (PROVENTIL HFA;VENTOLIN HFA) 108 (90 BASE) MCG/ACT inhaler Inhale 1-2 puffs into the lungs every 6 (six) hours as needed for wheezing or shortness of breath.     ibuprofen (ADVIL) 200 MG tablet 1 tablet with food or milk as needed     loratadine (CLARITIN) 10 MG tablet Take 10 mg by mouth every morning.      methocarbamol (ROBAXIN) 500 MG tablet Take 1 tablet (500 mg total) by mouth every 6 (six) hours as needed for muscle spasms. 80 tablet 0   methylphenidate (RITALIN) 10 MG tablet Take 10 mg by mouth 3 (three) times daily with meals.     metoprolol tartrate (LOPRESSOR) 50 MG tablet Take 2 hours before CT scan 1 tablet 0   No current facility-administered medications on file prior to visit.    Past Surgical History:  Procedure Laterality Date   COLONOSCOPY     PARTIAL KNEE ARTHROPLASTY Left 11/07/2014   Procedure: UNICOMPARTMENTAL LEFT KNEE REPLACEMENT;  Surgeon: Gearlean Alf, MD;  Location: [facility];  Service: Orthopedics;  Laterality: Left;   TONSILLECTOMY     age 72    Allergies  Allergen Reactions   Erythromycin     Other reaction(s): stomach upset   Oseltamivir     Other reaction(s):  nausea, insomnia   Sulfa Antibiotics     Hives and swelling    BP 118/70 (BP Location: Right Arm, Patient Position: Sitting, Cuff Size: Normal)   Ht '5\' 5"'$  (1.651 m)   Wt 145 lb (65.8 kg)   BMI 24.13 kg/m       No data to display              No data to display              Objective:  Physical Exam:  Gen: NAD, comfortable in exam room Right Hip: Some mild Ttp over the posterior lateral ascpect of the right greater trochanter. External rotation 60 degrees, internal rotation 30 degrees. Full hip flexion and extension. Improved stregnth in abduction compared to prior. No true Trendelenburg gait. Negative FADIR. Negative FABER.      Assessment & Plan:  1. 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 Recommended that she continue home exercises. She can use ice massage after tennis and take ibuprofen as needed. If her pain does not resolve after 6 weeks, we may consider ultrasounding the hip for further investigation. She can follow up in 6 weeks as needed.  Virgel Manifold, MS4  I observed and examined the patient with the medical student and and agree with assessment and plan.  Note reviewed and modified by me. Ila Mcgill, MD

## 2022-08-01 DIAGNOSIS — K5730 Diverticulosis of large intestine without perforation or abscess without bleeding: Secondary | ICD-10-CM | POA: Diagnosis not present

## 2022-08-01 DIAGNOSIS — K5900 Constipation, unspecified: Secondary | ICD-10-CM | POA: Diagnosis not present

## 2022-08-01 DIAGNOSIS — Z1211 Encounter for screening for malignant neoplasm of colon: Secondary | ICD-10-CM | POA: Diagnosis not present

## 2022-08-01 DIAGNOSIS — Z800 Family history of malignant neoplasm of digestive organs: Secondary | ICD-10-CM | POA: Diagnosis not present

## 2022-08-01 DIAGNOSIS — K625 Hemorrhage of anus and rectum: Secondary | ICD-10-CM | POA: Diagnosis not present

## 2022-08-03 ENCOUNTER — Encounter: Payer: Self-pay | Admitting: Cardiology

## 2022-09-20 ENCOUNTER — Other Ambulatory Visit: Payer: Self-pay | Admitting: Family Medicine

## 2022-09-20 DIAGNOSIS — Z1231 Encounter for screening mammogram for malignant neoplasm of breast: Secondary | ICD-10-CM

## 2022-09-25 DIAGNOSIS — Z800 Family history of malignant neoplasm of digestive organs: Secondary | ICD-10-CM | POA: Diagnosis not present

## 2022-09-25 DIAGNOSIS — K625 Hemorrhage of anus and rectum: Secondary | ICD-10-CM | POA: Diagnosis not present

## 2022-09-25 DIAGNOSIS — K5730 Diverticulosis of large intestine without perforation or abscess without bleeding: Secondary | ICD-10-CM | POA: Diagnosis not present

## 2022-09-25 DIAGNOSIS — Z1211 Encounter for screening for malignant neoplasm of colon: Secondary | ICD-10-CM | POA: Diagnosis not present

## 2022-10-10 ENCOUNTER — Ambulatory Visit
Admission: RE | Admit: 2022-10-10 | Discharge: 2022-10-10 | Disposition: A | Discharge status: Home / Self Care | Payer: PPO | Source: Ambulatory Visit

## 2022-10-10 DIAGNOSIS — Z1231 Encounter for screening mammogram for malignant neoplasm of breast: Secondary | ICD-10-CM | POA: Diagnosis not present

## 2022-10-15 ENCOUNTER — Other Ambulatory Visit: Payer: Self-pay | Admitting: Family Medicine

## 2022-10-15 DIAGNOSIS — E559 Vitamin D deficiency, unspecified: Secondary | ICD-10-CM

## 2022-10-31 DIAGNOSIS — H52203 Unspecified astigmatism, bilateral: Secondary | ICD-10-CM | POA: Diagnosis not present

## 2022-10-31 DIAGNOSIS — H2513 Age-related nuclear cataract, bilateral: Secondary | ICD-10-CM | POA: Diagnosis not present

## 2022-11-17 ENCOUNTER — Encounter (HOSPITAL_BASED_OUTPATIENT_CLINIC_OR_DEPARTMENT_OTHER): Payer: Self-pay | Admitting: Cardiology

## 2022-11-18 DIAGNOSIS — I2510 Atherosclerotic heart disease of native coronary artery without angina pectoris: Secondary | ICD-10-CM | POA: Diagnosis not present

## 2022-11-18 DIAGNOSIS — E785 Hyperlipidemia, unspecified: Secondary | ICD-10-CM | POA: Diagnosis not present

## 2022-11-19 LAB — LIPID PANEL
Chol/HDL Ratio: 2.6 ratio (ref 0.0–4.4)
Cholesterol, Total: 190 mg/dL (ref 100–199)
HDL: 73 mg/dL (ref >39)
LDL Chol Calc (NIH): 98 mg/dL (ref 0–99)
Triglycerides: 108 mg/dL (ref 0–149)
VLDL Cholesterol Cal: 19 mg/dL (ref 5–40)

## 2022-11-20 ENCOUNTER — Ambulatory Visit: Payer: PPO | Attending: Nurse Practitioner | Admitting: Nurse Practitioner

## 2022-11-20 ENCOUNTER — Ambulatory Visit (INDEPENDENT_AMBULATORY_CARE_PROVIDER_SITE_OTHER): Payer: PPO

## 2022-11-20 ENCOUNTER — Encounter: Payer: Self-pay | Admitting: Nurse Practitioner

## 2022-11-20 VITALS — BP 126/72 | HR 72 | Ht 65.0 in | Wt 147.6 lb

## 2022-11-20 DIAGNOSIS — R0609 Other forms of dyspnea: Secondary | ICD-10-CM | POA: Diagnosis not present

## 2022-11-20 DIAGNOSIS — I2510 Atherosclerotic heart disease of native coronary artery without angina pectoris: Secondary | ICD-10-CM

## 2022-11-20 DIAGNOSIS — R55 Syncope and collapse: Secondary | ICD-10-CM

## 2022-11-20 DIAGNOSIS — I951 Orthostatic hypotension: Secondary | ICD-10-CM | POA: Diagnosis not present

## 2022-11-20 DIAGNOSIS — Z8669 Personal history of other diseases of the nervous system and sense organs: Secondary | ICD-10-CM | POA: Diagnosis not present

## 2022-11-20 DIAGNOSIS — E785 Hyperlipidemia, unspecified: Secondary | ICD-10-CM | POA: Diagnosis not present

## 2022-11-20 DIAGNOSIS — R42 Dizziness and giddiness: Secondary | ICD-10-CM | POA: Diagnosis not present

## 2022-11-20 NOTE — Progress Notes (Signed)
Office Visit    Patient Name: Nichole [name] Date of Encounter: 11/20/2022  Primary Care Provider:  [name], PA-C Primary Cardiologist:  Donato Heinz, MD  Chief Complaint    71 year old female with a history of pre-syncope, dizziness, mild nonobstructive CAD, hyperlipidemia, asthma, vertigo, and ADD who presents for follow-up related to dizziness and pre-syncope.  Past Medical History    Past Medical History:  Diagnosis Date   ADD (attention deficit disorder)    Arthritis    Asthma    allergy induced asthma +   High cholesterol    Osteoarthritis    Pneumonia    hx of pneumonia    Past Surgical History:  Procedure Laterality Date   COLONOSCOPY     PARTIAL KNEE ARTHROPLASTY Left 11/07/2014   Procedure: UNICOMPARTMENTAL LEFT KNEE REPLACEMENT;  Surgeon: Gearlean Alf, MD;  Location: [facility];  Service: Orthopedics;  Laterality: Left;   TONSILLECTOMY     age 30    Allergies  Allergies  Allergen Reactions   Erythromycin     Other reaction(s): stomach upset   Oseltamivir     Other reaction(s): nausea, insomnia   Sulfa Antibiotics     Hives and swelling    History of Present Illness    71 year old female with the above past medical history including pre-syncope, dizziness, mild nonobstructive CAD, hyperlipidemia, vertigo, asthma, and ADD.  She was initially evaluated by Dr. Gardiner Rhyme in January 2023 in the setting of atypical chest pain.  She denied any exertional symptoms at the time.  Echocardiogram showed normal biventricular function, G1 DD, no significant valvular disease.  Coronary CTA in 12/2021 showed mild nonobstructive CAD (calcium score 58, 60th percentile).  She was last seen in the office on 07/16/2022 and was stable from a cardiac standpoint.  She was started on rosuvastatin 5 mg daily.  She was later advised to increase this to 10 mg daily.  She contacted our office on 11/19/2022 and reported an episode of pre-syncope over the weekend and  as well as intermittent dizziness.  She presents today for follow-up.  Since her last visit she has been stable from a cardiac standpoint.  She states that she never actually lost consciousness during her recent episode of presyncope.  She got up to use the bathroom and felt dizzy, she thought she was going to throw up, and was pale.  She states this was similar to her prior episode of vertigo.  She lowered herself to the ground and after a few minutes she felt better.  However, she has continued to note intermittent dizziness as well as intermittent blurred vision.  She states she recently saw her eye doctor and her exam was unremarkable.  She denies any recurrent presyncope, syncope.  Other than her ongoing dizziness, she denies any additional concerns today.  Home Medications    Current Outpatient Medications  Medication Sig Dispense Refill   albuterol (PROVENTIL HFA;VENTOLIN HFA) 108 (90 BASE) MCG/ACT inhaler Inhale 1-2 puffs into the lungs every 6 (six) hours as needed for wheezing or shortness of breath.     Cholecalciferol (VITAMIN D) 50 MCG (2000 UT) CAPS 1 tablet     loratadine (CLARITIN) 10 MG tablet Take 10 mg by mouth every morning.      methylphenidate (RITALIN) 10 MG tablet Take 10 mg by mouth 3 (three) times daily with meals.     rosuvastatin (CRESTOR) 5 MG tablet Take 1 tablet (5 mg total) by mouth daily. 90 tablet 3  ibuprofen (ADVIL) 200 MG tablet 1 tablet with food or milk as needed (Patient not taking: Reported on 11/20/2022)     Red Yeast Rice 600 MG CAPS See admin instructions. (Patient not taking: Reported on 11/20/2022)     No current facility-administered medications for this visit.     Review of Systems    She denies chest pain, palpitations, dyspnea, pnd, orthopnea, n, v, syncope, edema, weight gain, or early satiety. All other systems reviewed and are otherwise negative except as noted above.   Physical Exam    VS:  BP 126/72   Pulse 72   Ht '5\' 5"'$  (1.651 m)    Wt 147 lb 9.6 oz (67 kg)   BMI 24.56 kg/m   GEN: Well nourished, well developed, in no acute distress. HEENT: normal. Neck: Supple, no JVD, carotid bruits, or masses. Cardiac: RRR, no murmurs, rubs, or gallops. No clubbing, cyanosis, edema.  Radials/DP/PT 2+ and equal bilaterally.  Respiratory:  Respirations regular and unlabored, clear to auscultation bilaterally. GI: Soft, nontender, nondistended, BS + x 4. MS: no deformity or atrophy. Skin: warm and dry, no rash. Neuro:  Strength and sensation are intact. Psych: Normal affect.  Accessory Clinical Findings    ECG personally reviewed by me today -NSR, 72 bpm- no acute changes.   Lab Results  Component Value Date   WBC 10.5 11/08/2014   HGB 12.5 11/08/2014   HCT 35.8 (L) 11/08/2014   MCV 88.6 11/08/2014   PLT 232 11/08/2014   Lab Results  Component Value Date   CREATININE 0.88 01/01/2022   BUN 18 01/01/2022   NA 138 01/01/2022   K 4.6 01/01/2022   CL 97 01/01/2022   CO2 27 01/01/2022   Lab Results  Component Value Date   ALT 18 10/27/2014   AST 25 10/27/2014   ALKPHOS 70 10/27/2014   BILITOT 0.6 10/27/2014   Lab Results  Component Value Date   CHOL 190 11/18/2022   HDL 73 11/18/2022   LDLCALC 98 11/18/2022   TRIG 108 11/18/2022   CHOLHDL 2.6 11/18/2022    No results found for: "HGBA1C"  Assessment & Plan   1. Pre-syncope/dizziness/intermittent blurred vision/orthostatic hypotension: She had a recent episode of presyncope and has had intermittent dizziness since this time.  She states her symptoms were similar to prior episodes of vertigo.  She has also noted intermittent blurred vision.  She denies any further presyncope, syncope, denies palpitations. Echo in 12/2021 was essentially normal.  She does have nonobstructive CAD as below, denies symptoms concerning for angina.  She had a recent normal eye exam.  She is mildly orthostatic in office today.  She is not on any blood pressure medication at this time.  Will  check echo, carotid Dopplers, 7-day Zio patch to rule out cardiac source.  Will check CMET, CBC today.  If cardiac workup unrevealing, recommend follow-up with PCP.  2. CAD: Coronary CTA in 12/2021 showed mild nonobstructive CAD (calcium score 58, 60th percentile).  Continue Crestor.  3. Hyperlipidemia: LDL was 98 in 10/2022. Dr. Gardiner Rhyme increasing Crestor to 10 mg daily, however, patient has not yet made this change.  She wishes to wait before increasing her dose.  She has noted intermittent blurred vision since starting Crestor.  She wonders if this is could be a side effect of the medication.  If above workup unrevealing, could consider brief statin holiday to see if her symptoms improve.    4. Disposition: Follow-up in 6 to 8 weeks.  Lenna Sciara, NP 11/20/2022, 10:39 AM

## 2022-11-20 NOTE — Patient Instructions (Addendum)
Medication Instructions:  Your physician recommends that you continue on your current medications as directed. Please refer to the Current Medication list given to you today.   *If you need a refill on your cardiac medications before your next appointment, please call your pharmacy*   Lab Work: Your physician recommends that you complete lab work today. BMET & CBC  If you have labs (blood work) drawn today and your tests are completely normal, you will receive your results only by: Progress (if you have MyChart) OR A paper copy in the mail If you have any lab test that is abnormal or we need to change your treatment, we will call you to review the results.   Testing/Procedures: Your physician has requested that you have an echocardiogram. Echocardiography is a painless test that uses sound waves to create images of your heart. It provides your doctor with information about the size and shape of your heart and how well your heart's chambers and valves are working. This procedure takes approximately one hour. There are no restrictions for this procedure. Please do NOT wear cologne, perfume, aftershave, or lotions (deodorant is allowed). Please arrive 15 minutes prior to your appointment time.  Your physician has requested that you have a carotid duplex (dopplers). This test is an ultrasound of the carotid arteries in your neck. It looks at blood flow through these arteries that supply the brain with blood. Allow one hour for this exam. There are no restrictions or special instructions.   ZIO XT- Long Term Monitor Instructions  Your physician has requested you wear a ZIO patch monitor for 7 days.  This is a single patch monitor. [employer] supplies one patch monitor per enrollment. Additional stickers are not available. Please do not apply patch if you will be having a Nuclear Stress Test,  Echocardiogram, Cardiac CT, MRI, or Chest Xray during the period you would be wearing the   monitor. The patch cannot be worn during these tests. You cannot remove and re-apply the  ZIO XT patch monitor.  Your ZIO patch monitor will be mailed 3 day USPS to your address on file. It may take 3-5 days  to receive your monitor after you have been enrolled.  Once you have received your monitor, please review the enclosed instructions. Your monitor  has already been registered assigning a specific monitor serial # to you.  Billing and Patient Assistance Program Information  We have supplied [employer] with any of your insurance information on file for billing purposes. [employer] offers a sliding scale Patient Assistance Program for patients that do not have  insurance, or whose insurance does not completely cover the cost of the ZIO monitor.  You must apply for the Patient Assistance Program to qualify for this discounted rate.  To apply, please call [employer] at [phone], select option 4, select option 2, ask to apply for  Patient Assistance Program. Theodore Demark will ask your household income, and how many people  are in your household. They will quote your out-of-pocket cost based on that information.  [employer] will also be able to set up a 59-month interest-free payment plan if needed.  Applying the monitor   Shave hair from upper left chest.  Hold abrader disc by orange tab. Rub abrader in 40 strokes over the upper left chest as  indicated in your monitor instructions.  Clean area with 4 enclosed alcohol pads. Let dry.  Apply patch as indicated in monitor instructions. Patch will be placed under collarbone on left  side of chest with arrow pointing upward.  Rub patch adhesive wings for 2 minutes. Remove white label marked "1". Remove the white  label marked "2". Rub patch adhesive wings for 2 additional minutes.  While looking in a mirror, press and release button in center of patch. A small green light will  flash 3-4 times. This will be your only indicator that the monitor has been  turned on.  Do not shower for the first 24 hours. You may shower after the first 24 hours.  Press the button if you feel a symptom. You will hear a small click. Record Date, Time and  Symptom in the Patient Logbook.  When you are ready to remove the patch, follow instructions on the last 2 pages of Patient  Logbook. Stick patch monitor onto the last page of Patient Logbook.  Place Patient Logbook in the blue and white box. Use locking tab on box and tape box closed  securely. The blue and white box has prepaid postage on it. Please place it in the mailbox as  soon as possible. Your physician should have your test results approximately 7 days after the  monitor has been mailed back to [facility].  Call [facility] at [phone] if you have questions regarding  your ZIO XT patch monitor. Call them immediately if you see an orange light blinking on your  monitor.  If your monitor falls off in less than 4 days, contact our Monitor department at [phone].  If your monitor becomes loose or falls off after 4 days call [employer] at [phone] for  suggestions on securing your monitor       Follow-Up: At [facility], you and your health needs are our priority.  As part of our continuing mission to provide you with exceptional heart care, we have created designated Provider Care Teams.  These Care Teams include your primary Cardiologist (physician) and Advanced Practice Providers (APPs -  Physician Assistants and Nurse Practitioners) who all work together to provide you with the care you need, when you need it.  We recommend signing up for the patient portal called "MyChart".  Sign up information is provided on this After Visit Summary.  MyChart is used to connect with patients for Virtual Visits (Telemedicine).  Patients are able to view lab/test results, encounter notes, upcoming appointments, etc.  Non-urgent messages can be sent to your provider as well.    To learn more about what you can do with MyChart, go to [email or web address].    Your next appointment:   6-8 week(s)  The format for your next appointment:   In Person  Provider:   Donato Heinz, MD  or Diona Browner, NP        Other Instructions   Important Information About Sugar

## 2022-11-20 NOTE — Progress Notes (Unsigned)
Enrolled patient for a 7 day Zio XT monitor to be mailed to patients home   Dr Gardiner Rhyme to read

## 2022-11-21 DIAGNOSIS — L821 Other seborrheic keratosis: Secondary | ICD-10-CM | POA: Diagnosis not present

## 2022-11-21 DIAGNOSIS — D2271 Melanocytic nevi of right lower limb, including hip: Secondary | ICD-10-CM | POA: Diagnosis not present

## 2022-11-21 DIAGNOSIS — D2261 Melanocytic nevi of right upper limb, including shoulder: Secondary | ICD-10-CM | POA: Diagnosis not present

## 2022-11-21 DIAGNOSIS — L570 Actinic keratosis: Secondary | ICD-10-CM | POA: Diagnosis not present

## 2022-11-21 DIAGNOSIS — L661 Lichen planopilaris: Secondary | ICD-10-CM | POA: Diagnosis not present

## 2022-11-21 LAB — BASIC METABOLIC PANEL
BUN/Creatinine Ratio: 16 (ref 12–28)
BUN: 15 mg/dL (ref 8–27)
CO2: 29 mmol/L (ref 20–29)
Calcium: 10.1 mg/dL (ref 8.7–10.3)
Chloride: 100 mmol/L (ref 96–106)
Creatinine, Ser: 0.93 mg/dL (ref 0.57–1.00)
Glucose: 89 mg/dL (ref 70–99)
Potassium: 4.6 mmol/L (ref 3.5–5.2)
Sodium: 139 mmol/L (ref 134–144)
eGFR: 65 mL/min/{1.73_m2} (ref >59)

## 2022-11-21 LAB — CBC
Hematocrit: 38.4 % (ref 34.0–46.6)
Hemoglobin: 12.7 g/dL (ref 11.1–15.9)
MCH: 27.4 pg (ref 26.6–33.0)
MCHC: 33.1 g/dL (ref 31.5–35.7)
MCV: 83 fL (ref 79–97)
Platelets: 279 10*3/uL (ref 150–450)
RBC: 4.64 x10E6/uL (ref 3.77–5.28)
RDW: 14.2 % (ref 11.7–15.4)
WBC: 5.9 10*3/uL (ref 3.4–10.8)

## 2022-11-24 ENCOUNTER — Encounter (HOSPITAL_BASED_OUTPATIENT_CLINIC_OR_DEPARTMENT_OTHER): Payer: Self-pay | Admitting: Emergency Medicine

## 2022-11-24 ENCOUNTER — Other Ambulatory Visit: Payer: Self-pay

## 2022-11-24 ENCOUNTER — Emergency Department (HOSPITAL_BASED_OUTPATIENT_CLINIC_OR_DEPARTMENT_OTHER)
Admission: EM | Admit: 2022-11-24 | Discharge: 2022-11-24 | Disposition: A | Discharge status: Home / Self Care | Payer: PPO | Attending: Emergency Medicine | Admitting: Emergency Medicine

## 2022-11-24 DIAGNOSIS — M546 Pain in thoracic spine: Secondary | ICD-10-CM | POA: Diagnosis not present

## 2022-11-24 DIAGNOSIS — M549 Dorsalgia, unspecified: Secondary | ICD-10-CM | POA: Diagnosis present

## 2022-11-24 DIAGNOSIS — J45909 Unspecified asthma, uncomplicated: Secondary | ICD-10-CM | POA: Insufficient documentation

## 2022-11-24 DIAGNOSIS — M545 Low back pain, unspecified: Secondary | ICD-10-CM | POA: Diagnosis not present

## 2022-11-24 DIAGNOSIS — Z7951 Long term (current) use of inhaled steroids: Secondary | ICD-10-CM | POA: Insufficient documentation

## 2022-11-24 LAB — URINALYSIS, ROUTINE W REFLEX MICROSCOPIC
Bilirubin Urine: NEGATIVE
Glucose, UA: NEGATIVE mg/dL
Hgb urine dipstick: NEGATIVE
Ketones, ur: NEGATIVE mg/dL
Nitrite: NEGATIVE
Protein, ur: NEGATIVE mg/dL
Specific Gravity, Urine: 1.011 (ref 1.005–1.030)
pH: 7.5 (ref 5.0–8.0)

## 2022-11-24 MED ORDER — LIDOCAINE 4 % EX PTCH: 1.0000 | MEDICATED_PATCH | CUTANEOUS | 0 refills | Status: AC

## 2022-11-24 MED ORDER — LIDOCAINE 5 % EX PTCH
1.0000 | MEDICATED_PATCH | Freq: Once | CUTANEOUS | Status: DC
  Administered 2022-11-24: 1 via TRANSDERMAL
  Filled 2022-11-24: qty 1

## 2022-11-24 MED ORDER — NAPROXEN 500 MG PO TABS: 500.0000 mg | ORAL_TABLET | Freq: Two times a day (BID) | ORAL | 0 refills | Status: AC

## 2022-11-24 MED ORDER — OXYCODONE HCL 5 MG PO TABS
5.0000 mg | ORAL_TABLET | Freq: Once | ORAL | Status: AC
  Administered 2022-11-24: 5 mg via ORAL
  Filled 2022-11-24: qty 1

## 2022-11-24 MED ORDER — KETOROLAC TROMETHAMINE 30 MG/ML IJ SOLN
15.0000 mg | Freq: Once | INTRAMUSCULAR | Status: AC
  Administered 2022-11-24: 15 mg via INTRAMUSCULAR
  Filled 2022-11-24: qty 1

## 2022-11-24 MED ORDER — LIDOCAINE 4 % EX PTCH: 1.0000 | MEDICATED_PATCH | CUTANEOUS | 0 refills | Status: DC

## 2022-11-24 MED ORDER — METHOCARBAMOL 500 MG PO TABS: 1000.0000 mg | ORAL_TABLET | Freq: Every evening | ORAL | 0 refills | Status: DC

## 2022-11-24 MED ORDER — NAPROXEN 500 MG PO TABS: 500.0000 mg | ORAL_TABLET | Freq: Two times a day (BID) | ORAL | 0 refills | Status: DC

## 2022-11-24 MED ORDER — DIAZEPAM 2 MG PO TABS
2.0000 mg | ORAL_TABLET | Freq: Once | ORAL | Status: AC
  Administered 2022-11-24: 2 mg via ORAL
  Filled 2022-11-24: qty 1

## 2022-11-24 MED ORDER — METHOCARBAMOL 500 MG PO TABS: 1000.0000 mg | ORAL_TABLET | Freq: Every evening | ORAL | 0 refills | Status: AC

## 2022-11-24 NOTE — ED Notes (Signed)
Discharge paperwork given and verbally understood. 

## 2022-11-24 NOTE — ED Provider Notes (Signed)
[facility] DEPT Provider Note   CSN: [id number] Arrival date & time: 11/24/22  1231     History  Chief Complaint  Patient presents with   Back Pain    Nichole [name] is a 71 y.o. female presenting to the ED today with acute mid left back pain.  States a little over a week ago she was very active with tennis, and had been packing and moving boxes for 4 days.  Then 3 to 4 days ago, began having "back spasms".  States they are intermittent, worse with movement, and very uncomfortable.  Patient states she had these exact same symptoms in the same location a few years prior, and workup determined this to be muscle spasms.  Denies urinary symptoms, abdominal pain, N/V/D, fever, chills, neck stiffness, or midline back pain.  Denies numbness, tingling, or weakness of the legs.  Denies urinary or bowel incontinence, or saddle anesthesia.  No other complaints at this time.  The history is provided by the patient and medical records.  Back Pain      Home Medications Prior to Admission medications   Medication Sig Start Date End Date Taking? Authorizing Provider  albuterol (PROVENTIL HFA;VENTOLIN HFA) 108 (90 BASE) MCG/ACT inhaler Inhale 1-2 puffs into the lungs every 6 (six) hours as needed for wheezing or shortness of breath.    [provider]  Cholecalciferol (VITAMIN D) 50 MCG (2000 UT) CAPS 1 tablet    [provider]  lidocaine 4 % Place 1 patch onto the skin daily. 09/22/74   Prince Rome, PA-C  loratadine (CLARITIN) 10 MG tablet Take 10 mg by mouth every morning.     [provider]  methocarbamol (ROBAXIN) 500 MG tablet Take 2 tablets (1,000 mg total) by mouth at bedtime for 7 days. 11/24/22 10/16/84  Prince Rome, PA-C  methylphenidate (RITALIN) 10 MG tablet Take 10 mg by mouth 3 (three) times daily with meals.    [provider]  naproxen (NAPROSYN) 500 MG tablet Take 1 tablet (500 mg total) by mouth 2 (two)  times daily. 27/7/82   Prince Rome, PA-C  Red Yeast Rice 600 MG CAPS See admin instructions. Patient not taking: Reported on 11/20/2022    [provider]  rosuvastatin (CRESTOR) 5 MG tablet Take 1 tablet (5 mg total) by mouth daily. 07/16/22 07/11/23  Donato Heinz, MD      Allergies    Erythromycin, Oseltamivir, and Sulfa antibiotics    Review of Systems   Review of Systems  Musculoskeletal:  Positive for back pain.    Physical Exam Updated Vital Signs BP (!) 151/83 (BP Location: Right Arm)   Pulse (!) 56   Temp 98.2 F (36.8 C) (Oral)   Resp 16   SpO2 100%  Physical Exam Vitals and nursing note reviewed.  Constitutional:      General: She is not in acute distress.    Appearance: She is well-developed. She is not ill-appearing, toxic-appearing or diaphoretic.     Comments: Appears uncomfortable  HENT:     Head: Normocephalic and atraumatic.  Eyes:     Conjunctiva/sclera: Conjunctivae normal.  Cardiovascular:     Rate and Rhythm: Normal rate and regular rhythm.     Heart sounds: No murmur heard. Pulmonary:     Effort: Pulmonary effort is normal. No respiratory distress.     Breath sounds: Normal breath sounds.  Abdominal:     General: There is no distension.  Palpations: Abdomen is soft.     Tenderness: There is no abdominal tenderness. There is no right CVA tenderness, left CVA tenderness or guarding.  Musculoskeletal:        General: No swelling.     Cervical back: Neck supple.       Back:     Right lower leg: No edema.     Left lower leg: No edema.     Comments: BACK: No midline spinal tenderness.  No swelling, deformity, ecchymosis, bony tenderness, erythema, warmth, or edema.  Mild spasms appreciated in the area indicated above.  ROM appears overall intact.  Skin:    General: Skin is warm and dry.     Capillary Refill: Capillary refill takes less than 2 seconds.     Coloration: Skin is not jaundiced or pale.  Neurological:      Mental Status: She is alert and oriented to person, place, and time.     GCS: GCS eye subscore is 4. GCS verbal subscore is 5. GCS motor subscore is 6.     Gait: Gait normal.     Deep Tendon Reflexes:     Reflex Scores:      Patellar reflexes are 2+ on the right side and 2+ on the left side.      Achilles reflexes are 2+ on the right side and 2+ on the left side.    Comments: No saddle anesthesia.  Lower extremities appear neurovascularly intact.  ROM, strength, and coordination of lower extremities appears grossly intact.  Gait appears grossly intact.  Psychiatric:        Mood and Affect: Mood normal.     ED Results / Procedures / Treatments   Labs (all labs ordered are listed, but only abnormal results are displayed) Labs Reviewed  URINALYSIS, ROUTINE W REFLEX MICROSCOPIC - Abnormal; Notable for the following components:      Result Value   Leukocytes,Ua TRACE (*)    Bacteria, UA RARE (*)    All other components within normal limits    EKG None  Radiology No results found.  Procedures Procedures    Medications Ordered in ED Medications  lidocaine (LIDODERM) 5 % 1 patch (1 patch Transdermal Patch Applied 11/24/22 1537)  oxyCODONE (Oxy IR/ROXICODONE) immediate release tablet 5 mg (5 mg Oral Given 11/24/22 1539)  ketorolac (TORADOL) 30 MG/ML injection 15 mg (15 mg Intramuscular Given 11/24/22 1539)  diazepam (VALIUM) tablet 2 mg (2 mg Oral Given 11/24/22 1638)    ED Course/ Medical Decision Making/ A&P                           Medical Decision Making  71 y.o. female presents to the ED for concern of Back Pain   This involves an extensive number of treatment options, and is a complaint that carries with it a high risk of complications and morbidity.    Past Medical History / Co-morbidities / Social History: Hx of prior muscle spasms, osteoarthritis, ADD, hypercholesterolemia, asthma Social Determinants of Health include: Elderly  Additional History:  Obtained by  chart review.  Notably prior ED visit, see for details  Lab Tests: I ordered, and personally interpreted labs.  The pertinent results include:   UA unremarkable  Imaging Studies: None  ED Course: Pt well-appearing on exam.  Patient with acute left mid-back pain after vigorous activity and several days of lifting heavy items.  Hx of muscle spasms and exact same location several years prior  per patient.  No neurological deficits and normal neuro exam as described above.  Without midline spinal tenderness.  Considered x-ray imaging, however low concern for fracture or dislocation.  No recent URI symptoms or cough.  No CVA or abdominal tenderness.  No urinary symptoms or constipation/diarrhea.  UA unremarkable.  Low suspicion for acute renal calculi at this time.  Afebrile without N/V/D.  Doubt pyelonephritis.  Patient can walk however states is somewhat painful.  Gait intact.  No loss of bowel or bladder control, or saddle anesthesia.  Low concern for cauda equina, discitis, or infectious spinal process.  No fever, night sweats, weight loss, h/o cancer, IVDU.  SLRs negative bilaterally.  DTRs appear normal bilaterally.  Lower extremities appear neurovascularly intact.  ROM mildly limited due to elicited pain.  Pain managed in ED.  Upon re-evaluation, pt reports improvement of symptoms.  Heat therapy and conservative symptom mangagement discussed with patient.  Recommend close follow up with PCP/Orthopedics.  Patient reports satisfaction with today's encounter.  Patient in NAD and in good condition at time of discharge.  Disposition: After consideration the patient's encounter today, I do not feel today's workup suggests an emergent condition requiring admission or immediate intervention beyond what has been performed at this time.  Safe for discharge; instructed to return immediately for worsening symptoms, change in symptoms or any other concerns.  I have reviewed the patients home medicines and have made  adjustments as needed.  Discussed course of treatment with the patient, whom demonstrated understanding.  Patient in agreement and has no further questions.    This chart was dictated using voice recognition software.  Despite best efforts to proofread, errors can occur which can change the documentation meaning.         Final Clinical Impression(s) / ED Diagnoses Final diagnoses:  Mid back pain on left side    Rx / DC Orders ED Discharge Orders          Ordered    methocarbamol (ROBAXIN) 500 MG tablet  Nightly,   Status:  Discontinued        11/24/22 1646    naproxen (NAPROSYN) 500 MG tablet  2 times daily,   Status:  Discontinued        11/24/22 1646    lidocaine 4 %  Every 24 hours,   Status:  Discontinued        11/24/22 1646    lidocaine 4 %  Every 24 hours        11/24/22 1647    methocarbamol (ROBAXIN) 500 MG tablet  Nightly        11/24/22 1647    naproxen (NAPROSYN) 500 MG tablet  2 times daily        11/24/22 1647              Prince Rome, PA-C 45/62/56 1655    Blanchie Dessert, MD 12/03/22 [phone]

## 2022-11-24 NOTE — Discharge Instructions (Addendum)
You were evaluated in the emergency department for back pain.  This is likely due to muscle spasm.  A muscle relaxant, an anti-inflammatory, and lidocaine patches have been sent to your pharmacy.  Please use these as directed.  Also keep in mind, muscle relaxants can cause significant drowsiness, do not drive or operate machinery after taking these.  If the drowsiness is more than you feel he can handle, you may stop taking this medication at any time.  Follow-up with your PCP within the next 2 to 3 days for reevaluation and continued medical management.  Return to the ED for any new or worsening symptoms as discussed.

## 2022-11-24 NOTE — ED Triage Notes (Signed)
Lower back spasms for 4 days. No urinary symptoms. Had this about 3 years ago, treated with pain meds and recovered.

## 2022-11-25 DIAGNOSIS — I2510 Atherosclerotic heart disease of native coronary artery without angina pectoris: Secondary | ICD-10-CM

## 2022-11-25 DIAGNOSIS — R42 Dizziness and giddiness: Secondary | ICD-10-CM | POA: Diagnosis not present

## 2022-11-25 DIAGNOSIS — R55 Syncope and collapse: Secondary | ICD-10-CM

## 2022-11-26 DIAGNOSIS — M549 Dorsalgia, unspecified: Secondary | ICD-10-CM | POA: Diagnosis not present

## 2022-11-26 DIAGNOSIS — Z6824 Body mass index (BMI) 24.0-24.9, adult: Secondary | ICD-10-CM | POA: Diagnosis not present

## 2022-11-26 DIAGNOSIS — Z23 Encounter for immunization: Secondary | ICD-10-CM | POA: Diagnosis not present

## 2022-12-07 ENCOUNTER — Encounter (HOSPITAL_COMMUNITY): Payer: Self-pay

## 2022-12-07 ENCOUNTER — Other Ambulatory Visit: Payer: Self-pay

## 2022-12-07 ENCOUNTER — Emergency Department (HOSPITAL_COMMUNITY)
Admission: EM | Admit: 2022-12-07 | Discharge: 2022-12-07 | Disposition: A | Discharge status: Home / Self Care | Payer: PPO | Attending: Emergency Medicine | Admitting: Emergency Medicine

## 2022-12-07 DIAGNOSIS — Z7951 Long term (current) use of inhaled steroids: Secondary | ICD-10-CM | POA: Insufficient documentation

## 2022-12-07 DIAGNOSIS — M62830 Muscle spasm of back: Secondary | ICD-10-CM | POA: Diagnosis not present

## 2022-12-07 DIAGNOSIS — M545 Low back pain, unspecified: Secondary | ICD-10-CM

## 2022-12-07 DIAGNOSIS — X58XXXA Exposure to other specified factors, initial encounter: Secondary | ICD-10-CM | POA: Insufficient documentation

## 2022-12-07 DIAGNOSIS — E785 Hyperlipidemia, unspecified: Secondary | ICD-10-CM | POA: Diagnosis not present

## 2022-12-07 DIAGNOSIS — S335XXA Sprain of ligaments of lumbar spine, initial encounter: Secondary | ICD-10-CM | POA: Diagnosis not present

## 2022-12-07 DIAGNOSIS — S39012A Strain of muscle, fascia and tendon of lower back, initial encounter: Secondary | ICD-10-CM | POA: Diagnosis not present

## 2022-12-07 DIAGNOSIS — M5459 Other low back pain: Secondary | ICD-10-CM | POA: Diagnosis not present

## 2022-12-07 DIAGNOSIS — J45909 Unspecified asthma, uncomplicated: Secondary | ICD-10-CM | POA: Insufficient documentation

## 2022-12-07 DIAGNOSIS — R55 Syncope and collapse: Secondary | ICD-10-CM | POA: Diagnosis not present

## 2022-12-07 DIAGNOSIS — R42 Dizziness and giddiness: Secondary | ICD-10-CM | POA: Diagnosis not present

## 2022-12-07 DIAGNOSIS — Z79899 Other long term (current) drug therapy: Secondary | ICD-10-CM | POA: Diagnosis not present

## 2022-12-07 MED ORDER — OXYCODONE HCL 5 MG PO TABS
5.0000 mg | ORAL_TABLET | Freq: Once | ORAL | Status: AC
  Administered 2022-12-07: 5 mg via ORAL
  Filled 2022-12-07: qty 1

## 2022-12-07 MED ORDER — KETOROLAC TROMETHAMINE 30 MG/ML IJ SOLN
30.0000 mg | Freq: Once | INTRAMUSCULAR | Status: AC
  Administered 2022-12-07: 30 mg via INTRAMUSCULAR
  Filled 2022-12-07: qty 1

## 2022-12-07 MED ORDER — OXYCODONE HCL 5 MG PO TABS: 5.0000 mg | ORAL_TABLET | Freq: Four times a day (QID) | ORAL | 0 refills | Status: AC | PRN

## 2022-12-07 NOTE — Discharge Instructions (Addendum)
You were seen in the emergency department for lower back pain.  As we discussed, I think your symptoms are likely related to a strain of your lumbar muscles. We have given you a dose of toradol and oxycodone.  You can continue the prednisone, methocarbamol, naproxen, and the short course of oxycodone I've prescribed you. Continue using the lidocaine patches and heating pad as well. Try not to take the muscle relaxer and oxycodone at the same time, unless it's right before bed, as it could make you too sedated.  I would stop the gabapentin for now.  I recommend following up with your primary doctor next week as you're able.   Continue to monitor how you're doing and return to the ER for new or worsening symptoms.

## 2022-12-07 NOTE — ED Triage Notes (Signed)
Patient states that she is here for back spasms, she was seen at [facility] 2 weeks ago for the same thing

## 2022-12-08 NOTE — ED Provider Notes (Cosign Needed Addendum)
[facility] DEPT Provider Note   CSN: [id number] Arrival date & time: 12/07/22  2109     History  Chief Complaint  Patient presents with   Back Pain    Nichole [name] is a 71 y.o. female with history of asthma, hyperlipidemia, ADD, and arthritis who presents the emergency department complaining of lower back pain.  Patient states that 2 weeks ago she was seen in the ER for similar symptoms.  She had a normal workup at that time, and had good resolution of her symptoms with prescribed medications.  She was prescribed lidocaine patches, muscle relaxer, and naproxen.  She had also been given a shot of Toradol and a dose of oxycodone.  Symptoms resolved a few days later.  They returned yesterday, after patient was lifting some heavier boxes.  She has been trying the prescribed medications from before without relief.  She went to a walk-in clinic today, and they gave her prednisone and gabapentin.  She took that and has not had any relief.  No numbness, tingling, weakness, urinary retention, urine or bowel incontinence.  No history of cancer or IV drug use.   Back Pain      Home Medications Prior to Admission medications   Medication Sig Start Date End Date Taking? Authorizing Provider  oxyCODONE (ROXICODONE) 5 MG immediate release tablet Take 1 tablet (5 mg total) by mouth every 6 (six) hours as needed for severe pain or breakthrough pain. 12/07/22  Yes Lillard Bailon T, PA-C  albuterol (PROVENTIL HFA;VENTOLIN HFA) 108 (90 BASE) MCG/ACT inhaler Inhale 1-2 puffs into the lungs every 6 (six) hours as needed for wheezing or shortness of breath.    [provider]  Cholecalciferol (VITAMIN D) 50 MCG (2000 UT) CAPS 1 tablet    [provider]  lidocaine 4 % Place 1 patch onto the skin daily. 81/0/17   Prince Rome, PA-C  loratadine (CLARITIN) 10 MG tablet Take 10 mg by mouth every morning.     [provider]  methylphenidate  (RITALIN) 10 MG tablet Take 10 mg by mouth 3 (three) times daily with meals.    [provider]  naproxen (NAPROSYN) 500 MG tablet Take 1 tablet (500 mg total) by mouth 2 (two) times daily. 51/0/25   Prince Rome, PA-C  Red Yeast Rice 600 MG CAPS See admin instructions. Patient not taking: Reported on 11/20/2022    [provider]  rosuvastatin (CRESTOR) 5 MG tablet Take 1 tablet (5 mg total) by mouth daily. 07/16/22 07/11/23  Donato Heinz, MD      Allergies    Erythromycin, Oseltamivir, and Sulfa antibiotics    Review of Systems   Review of Systems  Musculoskeletal:  Positive for back pain.  All other systems reviewed and are negative.   Physical Exam Updated Vital Signs BP 139/87 (BP Location: Right Arm)   Pulse 87   Temp 98.8 F (37.1 C) (Oral)   Resp 17   Ht '5\' 5"'$  (1.651 m)   Wt 65.8 kg   SpO2 99%   BMI 24.13 kg/m  Physical Exam Vitals and nursing note reviewed.  Constitutional:      Appearance: Normal appearance.  HENT:     Head: Normocephalic and atraumatic.  Eyes:     Conjunctiva/sclera: Conjunctivae normal.  Pulmonary:     Effort: Pulmonary effort is normal. No respiratory distress.  Musculoskeletal:     Comments: Limited lumbar ROM due to pain. No midline spinal tenderness, step-offs  or crepitus.  Strength 5/5 in all extremities.  Sensation intact in all extremities.  Skin:    General: Skin is warm and dry.  Neurological:     Mental Status: She is alert.  Psychiatric:        Mood and Affect: Mood normal.        Behavior: Behavior normal.     ED Results / Procedures / Treatments   Labs (all labs ordered are listed, but only abnormal results are displayed) Labs Reviewed - No data to display  EKG None  Radiology No results found.  Procedures Procedures    Medications Ordered in ED Medications  ketorolac (TORADOL) 30 MG/ML injection 30 mg (30 mg Intramuscular Given 12/07/22 2236)  oxyCODONE (Oxy IR/ROXICODONE)  immediate release tablet 5 mg (5 mg Oral Given 12/07/22 2235)    ED Course/ Medical Decision Making/ A&P                           Medical Decision Making Risk Prescription drug management.  This patient is a 71 y.o. female who presents to the ED for concern of low back pain. No known injury but was lifting some heavy boxes around the house.   Differential diagnoses prior to evaluation: Fracture (acute/chronic), muscle strain, cauda equina, spinal stenosis, DDD, ligamentous injury, disk herniation, metastatic cancer, vertebral osteomyelitis, kidney stone, pyelonephritis, AAA, pancreatitis, bowel obstruction, meningitis.  Past Medical History / Social History / Additional history: Chart reviewed. Pertinent results include: asthma, hyperlipidemia, ADD, and arthritis  Physical Exam: Physical exam performed. The pertinent findings include: Limited lumbar ROM due to pain. No midline spinal tenderness, step-offs or crepitus.   Neurovascularly intact in all extremities.   Medications / Treatment: Given toradol and one time dose roxicodone   Disposition: Patient with back pain.  No neurological deficits and normal neuro exam.  Patient can walk but states is painful.  No loss of bowel or bladder control.  No concern for cauda equina.  No fever, night sweats, weight loss, h/o cancer, IVDU.   After consideration of the diagnostic results and the patients response to treatment, I feel that emergency department workup does not suggest an emergent condition requiring admission or immediate intervention beyond what has been performed at this time. The plan is: discharge to home with short prescription of roxicodone for break through pain and recommend follow up with PCP. Suspect lumbar strain. Has lidocaine patches, prednisone, muscle relaxers, and anti-inflammatories at home. The patient is safe for discharge and has been instructed to return immediately for worsening symptoms, change in symptoms or any  other concerns.  Final Clinical Impression(s) / ED Diagnoses Final diagnoses:  Acute midline low back pain without sciatica  Strain of lumbar region, initial encounter    Rx / DC Orders ED Discharge Orders          Ordered    oxyCODONE (ROXICODONE) 5 MG immediate release tablet  Every 6 hours PRN        12/07/22 2230           Portions of this report may have been transcribed using voice recognition software. Every effort was made to ensure accuracy; however, inadvertent computerized transcription errors may be present.    Salman Wellen T, PA-C 12/08/22 1218    Korene Dula T, PA-C 12/08/22 1218    Fredia Sorrow, MD 12/09/22 1818

## 2022-12-10 ENCOUNTER — Ambulatory Visit (INDEPENDENT_AMBULATORY_CARE_PROVIDER_SITE_OTHER): Payer: PPO

## 2022-12-10 DIAGNOSIS — R42 Dizziness and giddiness: Secondary | ICD-10-CM

## 2022-12-10 DIAGNOSIS — R55 Syncope and collapse: Secondary | ICD-10-CM | POA: Diagnosis not present

## 2022-12-10 LAB — ECHOCARDIOGRAM COMPLETE: S' Lateral: 2.88 cm

## 2022-12-11 ENCOUNTER — Ambulatory Visit
Admission: RE | Admit: 2022-12-11 | Discharge: 2022-12-11 | Disposition: A | Discharge status: Home / Self Care | Payer: PPO | Source: Ambulatory Visit | Attending: Family Medicine | Admitting: Family Medicine

## 2022-12-11 ENCOUNTER — Other Ambulatory Visit: Payer: Self-pay | Admitting: Family Medicine

## 2022-12-11 DIAGNOSIS — M545 Low back pain, unspecified: Secondary | ICD-10-CM | POA: Diagnosis not present

## 2022-12-11 DIAGNOSIS — E559 Vitamin D deficiency, unspecified: Secondary | ICD-10-CM | POA: Diagnosis not present

## 2022-12-11 DIAGNOSIS — M8588 Other specified disorders of bone density and structure, other site: Secondary | ICD-10-CM | POA: Diagnosis not present

## 2022-12-11 DIAGNOSIS — G4710 Hypersomnia, unspecified: Secondary | ICD-10-CM | POA: Diagnosis not present

## 2022-12-11 DIAGNOSIS — E7800 Pure hypercholesterolemia, unspecified: Secondary | ICD-10-CM | POA: Diagnosis not present

## 2022-12-11 DIAGNOSIS — Z6824 Body mass index (BMI) 24.0-24.9, adult: Secondary | ICD-10-CM | POA: Diagnosis not present

## 2022-12-11 DIAGNOSIS — M549 Dorsalgia, unspecified: Secondary | ICD-10-CM | POA: Diagnosis not present

## 2022-12-13 ENCOUNTER — Telehealth: Payer: Self-pay

## 2022-12-13 NOTE — Telephone Encounter (Signed)
Correction, discussed echo & carotid doppler results with pt.

## 2022-12-13 NOTE — Telephone Encounter (Signed)
Spoke with pt. Pt was notified of echocardiogram & lab results. Pt will continue her current medication and follow up as planned.

## 2022-12-24 NOTE — Telephone Encounter (Signed)
Spoke with pt. Pt was notified of monitor results and recommendations. Pt will follow up on 01/17/23 as directed.

## 2023-01-15 DIAGNOSIS — Z6824 Body mass index (BMI) 24.0-24.9, adult: Secondary | ICD-10-CM | POA: Diagnosis not present

## 2023-01-15 DIAGNOSIS — Z13220 Encounter for screening for lipoid disorders: Secondary | ICD-10-CM | POA: Diagnosis not present

## 2023-01-15 DIAGNOSIS — Z1389 Encounter for screening for other disorder: Secondary | ICD-10-CM | POA: Diagnosis not present

## 2023-01-15 DIAGNOSIS — Z0000 Encounter for general adult medical examination without abnormal findings: Secondary | ICD-10-CM | POA: Diagnosis not present

## 2023-01-15 NOTE — Progress Notes (Signed)
Office Visit    Patient Name: Nichole [name] Date of Encounter: 01/17/2023  Primary Care Provider:  [name], PA-C Primary Cardiologist:  Donato Heinz, MD  Chief Complaint    72 year old female with a history of pre-syncope, dizziness, mild nonobstructive CAD, hyperlipidemia, asthma, vertigo, and ADD who presents for follow-up related to dizziness and pre-syncope.   Past Medical History    Past Medical History:  Diagnosis Date   ADD (attention deficit disorder)    Arthritis    Asthma    allergy induced asthma +   High cholesterol    Osteoarthritis    Pneumonia    hx of pneumonia    Past Surgical History:  Procedure Laterality Date   COLONOSCOPY     PARTIAL KNEE ARTHROPLASTY Left 11/07/2014   Procedure: UNICOMPARTMENTAL LEFT KNEE REPLACEMENT;  Surgeon: Gearlean Alf, MD;  Location: [facility];  Service: Orthopedics;  Laterality: Left;   TONSILLECTOMY     age 22    Allergies  Allergies  Allergen Reactions   Erythromycin     Other reaction(s): stomach upset   Oseltamivir     Other reaction(s): nausea, insomnia   Sulfa Antibiotics     Hives and swelling     Labs/Other Studies Reviewed    The following studies were reviewed today: Coronary CTA 01/10/2022: IMPRESSION: 1. Mild nonobstructive CAD, CADRADS = 1. There are two focal areas of step artifact; limited interpretation at those areas.   2. Coronary calcium score of 58. This was 60th percentile for age and sex matched control.   3. No left main, as LAD and LCX have separate ostia from the coronary sinus. Right dominance.  Cardiac Monitor 12/09/2022:    No significant arrhythmias  Patch Wear Time:  7 days and 2 hours (2023-12-04T19:57:15-0500 to 2023-12-11T22:30:11-499)   Patient had a min HR of 51 bpm, max HR of 130 bpm, and avg HR of 72 bpm. Predominant underlying rhythm was Sinus Rhythm. Slight P wave morphology changes were noted. 2 Supraventricular Tachycardia runs occurred, the  run with the fastest interval lasting  5 beats with a max rate of 130 bpm, the longest lasting 6 beats with an avg rate of 109 bpm. Some episodes of Supraventricular Tachycardia may be possible Atrial Tachycardia with variable block. Isolated SVEs were rare (<1.0%), SVE Couplets were rare  (<1.0%), and SVE Triplets were rare (<1.0%). Isolated VEs were rare (<1.0%, 612), VE Triplets were rare (<1.0%, 1), and no VE Couplets were present.  1 patient triggered event, corresponding to sinus rhythm  Echo 12/10/2022: IMPRESSIONS   1. Left ventricular ejection fraction, by estimation, is 60 to 65%. The  left ventricle has normal function. The left ventricle has no regional  wall motion abnormalities. Left ventricular diastolic parameters were  normal. The average left ventricular  global longitudinal strain is -23.3 %. The global longitudinal strain is  normal.   2. Right ventricular systolic function is normal. The right ventricular  size is normal. There is normal pulmonary artery systolic pressure.   3. The mitral valve is normal in structure. Trivial mitral valve  regurgitation. No evidence of mitral stenosis.   4. The aortic valve is tricuspid. Aortic valve regurgitation is not  visualized. No aortic stenosis is present.   5. Aortic dilatation noted. There is borderline dilatation of the  ascending aorta, measuring 39 mm.   6. The inferior vena cava is normal in size with greater than 50%  respiratory variability, suggesting right atrial pressure of 3  mmHg.   Comparison(s): No significant change from prior study. Borderline dilated  ascending aorta, may be due to angle of image, previously normal.   Conclusion(s)/Recommendation(s): Normal biventricular function without  evidence of hemodynamically significant valvular heart disease.   Carotid ultrasound 12/10/2022: Summary:  Right Carotid: The extracranial vessels were near-normal with only minimal  wall thickening or plaque.   Left  Carotid: The extracranial vessels were near-normal with only minimal  wall thickening or plaque.   Vertebrals:  Bilateral vertebral arteries demonstrate antegrade flow.  Subclavians: Normal flow hemodynamics were seen in bilateral subclavian arteries.   *See table(s) above for measurements and observations.    Electronically signed by Kathlyn Sacramento MD on 12/11/2022 at 9:10:40 AM.     Recent Labs: 11/20/2022: BUN 15; Creatinine, Ser 0.93; Hemoglobin 12.7; Platelets 279; Potassium 4.6; Sodium 139  Recent Lipid Panel    Component Value Date/Time   CHOL 190 11/18/2022 0844   TRIG 108 11/18/2022 0844   HDL 73 11/18/2022 0844   CHOLHDL 2.6 11/18/2022 0844   LDLCALC 98 11/18/2022 0844    History of Present Illness    72 year old female with the above past medical history including pre-syncope, dizziness, mild nonobstructive CAD, hyperlipidemia, vertigo, asthma, and ADD.   She was initially evaluated by Dr. Gardiner Rhyme in January 2023 in the setting of atypical chest pain.  She denied any exertional symptoms at the time.  Echocardiogram showed normal biventricular function, G1 DD, no significant valvular disease.  Coronary CTA in 12/2021 showed mild nonobstructive CAD (calcium score 58, 60th percentile).  She was started on rosuvastatin. She contacted our office on 11/19/2022 and reported an episode of pre-syncope over the weekend and as well as intermittent dizziness.  She was last seen in the office on 11/20/2022 and was stable from a cardiac standpoint.  She noted ongoing intermittent dizziness as well as intermittent blurred vision.  She denied any recurrent syncope or presyncope.  She was mildly orthostatic in office.  Echocardiogram was essentially normal, carotid Dopplers were unremarkable, 7-day Zio patch revealed predominantly sinus rhythm, 2 brief runs of SVT.   She presents today for follow-up.  Since her last visit she has been stable from a cardiac standpoint.  She noted 1 episode of  blurred vision that occurred 2 weeks ago while she was sitting on the couch watching TV.  She denies any dizziness, presyncope, syncope.  She denies any symptoms with activity.  She remains active and plays tennis regularly.  Overall, she reports feeling well.  Home Medications    Current Outpatient Medications  Medication Sig Dispense Refill   albuterol (PROVENTIL HFA;VENTOLIN HFA) 108 (90 BASE) MCG/ACT inhaler Inhale 1-2 puffs into the lungs every 6 (six) hours as needed for wheezing or shortness of breath.     Cholecalciferol (VITAMIN D) 50 MCG (2000 UT) CAPS 1 tablet     Coenzyme Q10 (COQ10) 100 MG CAPS Take 1 tablet by mouth daily.     cyanocobalamin (VITAMIN B12) 1000 MCG tablet Take 1,000 mcg by mouth daily.     loratadine (CLARITIN) 10 MG tablet Take 10 mg by mouth every morning.      methylphenidate (RITALIN) 10 MG tablet Take 10 mg by mouth 3 (three) times daily with meals.     naproxen (NAPROSYN) 500 MG tablet Take 1 tablet (500 mg total) by mouth 2 (two) times daily. 30 tablet 0   rosuvastatin (CRESTOR) 5 MG tablet Take 1 tablet (5 mg total) by mouth daily. 90 tablet 3  lidocaine 4 % Place 1 patch onto the skin daily. (Patient not taking: Reported on 01/17/2023) 15 patch 0   oxyCODONE (ROXICODONE) 5 MG immediate release tablet Take 1 tablet (5 mg total) by mouth every 6 (six) hours as needed for severe pain or breakthrough pain. (Patient not taking: Reported on 01/17/2023) 6 tablet 0   Red Yeast Rice 600 MG CAPS See admin instructions. (Patient not taking: Reported on 01/17/2023)     No current facility-administered medications for this visit.     Review of Systems    She denies chest pain, palpitations, dyspnea, pnd, orthopnea, n, v, dizziness, syncope, edema, weight gain, or early satiety. All other systems reviewed and are otherwise negative except as noted above.   Physical Exam    VS:  BP 104/68   Pulse 79   Ht '5\' 5"'$  (1.651 m)   Wt 146 lb (66.2 kg)   SpO2 95%   BMI  24.30 kg/m   GEN: Well nourished, well developed, in no acute distress. HEENT: normal. Neck: Supple, no JVD, carotid bruits, or masses. Cardiac: RRR, no murmurs, rubs, or gallops. No clubbing, cyanosis, edema.  Radials/DP/PT 2+ and equal bilaterally.  Respiratory:  Respirations regular and unlabored, clear to auscultation bilaterally. GI: Soft, nontender, nondistended, BS + x 4. MS: no deformity or atrophy. Skin: warm and dry, no rash. Neuro:  Strength and sensation are intact. Psych: Normal affect.  Accessory Clinical Findings    ECG personally reviewed by me today -no EKG in office today.   Lab Results  Component Value Date   WBC 5.9 11/20/2022   HGB 12.7 11/20/2022   HCT 38.4 11/20/2022   MCV 83 11/20/2022   PLT 279 11/20/2022   Lab Results  Component Value Date   CREATININE 0.93 11/20/2022   BUN 15 11/20/2022   NA 139 11/20/2022   K 4.6 11/20/2022   CL 100 11/20/2022   CO2 29 11/20/2022   Lab Results  Component Value Date   ALT 18 10/27/2014   AST 25 10/27/2014   ALKPHOS 70 10/27/2014   BILITOT 0.6 10/27/2014   Lab Results  Component Value Date   CHOL 190 11/18/2022   HDL 73 11/18/2022   LDLCALC 98 11/18/2022   TRIG 108 11/18/2022   CHOLHDL 2.6 11/18/2022    No results found for: "HGBA1C"  Assessment & Plan    1. Pre-syncope/dizziness/intermittent blurred vision/orthostatic hypotension: Recent episode of presyncope as well as intermittent dizziness/blurred vision.  She states her symptoms were similar to prior episodes of vertigo.  She has also noted intermittent blurred vision. Echo in 12/2021 was essentially normal.  She has nonobstructive CAD as below, denies symptoms concerning for angina.  She had a recent normal eye exam. She was mildly orthostatic as last OV. Repeat echo was normal, carotid Dopplers unremarkable, 7-day Zio patch showed 2 brief runs of SVT, otherwise NSR. Work-up to date reassuring. She has had 1 other episode of blurred vision while  sitting on the couch watching TV.  Denies any symptoms with activity, denies dizziness, presyncope, syncope.  Will trial 1 month statin holiday to see if her symptoms improve.  Otherwise, recommend follow-up with PCP.  2. CAD: Coronary CTA in 12/2021 showed mild nonobstructive CAD (calcium score 58, 60th percentile).  Stable with no anginal symptoms. Continue Crestor.   3. Hyperlipidemia: LDL was 98 in 10/2022. Dr. Gardiner Rhyme recommended increasing Crestor to 10 mg daily, however, patient has not yet made this change. She has noted intermittent blurred vision as  above since starting Crestor.  She wonders if this is could be a side effect of the medication.  Will trial a month statin holiday.  If no improvement in symptoms, resume Crestor 5 mg daily.   4. Disposition: Follow-up in 6 months, sooner if needed.      Lenna Sciara, NP 01/17/2023, 9:55 AM

## 2023-01-17 ENCOUNTER — Ambulatory Visit: Payer: Medicare HMO | Attending: Nurse Practitioner | Admitting: Nurse Practitioner

## 2023-01-17 ENCOUNTER — Encounter: Payer: Self-pay | Admitting: Nurse Practitioner

## 2023-01-17 VITALS — BP 104/68 | HR 79 | Ht 65.0 in | Wt 146.0 lb

## 2023-01-17 DIAGNOSIS — R42 Dizziness and giddiness: Secondary | ICD-10-CM | POA: Diagnosis not present

## 2023-01-17 DIAGNOSIS — I2510 Atherosclerotic heart disease of native coronary artery without angina pectoris: Secondary | ICD-10-CM

## 2023-01-17 DIAGNOSIS — E785 Hyperlipidemia, unspecified: Secondary | ICD-10-CM | POA: Diagnosis not present

## 2023-01-17 DIAGNOSIS — R55 Syncope and collapse: Secondary | ICD-10-CM | POA: Diagnosis not present

## 2023-01-17 DIAGNOSIS — Z8669 Personal history of other diseases of the nervous system and sense organs: Secondary | ICD-10-CM

## 2023-01-17 DIAGNOSIS — I951 Orthostatic hypotension: Secondary | ICD-10-CM | POA: Diagnosis not present

## 2023-01-17 NOTE — Telephone Encounter (Signed)
Spoke with pt. Pt is aware that it's ok to hold medication for 1 month as discussed previously and let Diona Browner NP know how she's feeling.

## 2023-01-17 NOTE — Patient Instructions (Signed)
Medication Instructions:  Stop Crestor for 1 month and call back to report how you're feeling.  *If you need a refill on your cardiac medications before your next appointment, please call your pharmacy*   Lab Work: NONE ordered at this time of appointment   If you have labs (blood work) drawn today and your tests are completely normal, you will receive your results only by: [place] (if you have MyChart) OR A paper copy in the mail If you have any lab test that is abnormal or we need to change your treatment, we will call you to review the results.   Testing/Procedures: NONE ordered at this time of appointment     Follow-Up: At [facility], you and your health needs are our priority.  As part of our continuing mission to provide you with exceptional heart care, we have created designated Provider Care Teams.  These Care Teams include your primary Cardiologist (physician) and Advanced Practice Providers (APPs -  Physician Assistants and Nurse Practitioners) who all work together to provide you with the care you need, when you need it.  We recommend signing up for the patient portal called "MyChart".  Sign up information is provided on this After Visit Summary.  MyChart is used to connect with patients for Virtual Visits (Telemedicine).  Patients are able to view lab/test results, encounter notes, upcoming appointments, etc.  Non-urgent messages can be sent to your provider as well.   To learn more about what you can do with MyChart, go to [email or web address].    Your next appointment:   6 month(s)  Provider:   Donato Heinz, MD     Other Instructions

## 2023-02-18 DIAGNOSIS — Z8049 Family history of malignant neoplasm of other genital organs: Secondary | ICD-10-CM | POA: Diagnosis not present

## 2023-02-18 DIAGNOSIS — Z825 Family history of asthma and other chronic lower respiratory diseases: Secondary | ICD-10-CM | POA: Diagnosis not present

## 2023-02-18 DIAGNOSIS — Z833 Family history of diabetes mellitus: Secondary | ICD-10-CM | POA: Diagnosis not present

## 2023-02-18 DIAGNOSIS — M1990 Unspecified osteoarthritis, unspecified site: Secondary | ICD-10-CM | POA: Diagnosis not present

## 2023-02-18 DIAGNOSIS — R69 Illness, unspecified: Secondary | ICD-10-CM | POA: Diagnosis not present

## 2023-02-18 DIAGNOSIS — Z008 Encounter for other general examination: Secondary | ICD-10-CM | POA: Diagnosis not present

## 2023-02-18 DIAGNOSIS — Z811 Family history of alcohol abuse and dependence: Secondary | ICD-10-CM | POA: Diagnosis not present

## 2023-02-18 DIAGNOSIS — Z818 Family history of other mental and behavioral disorders: Secondary | ICD-10-CM | POA: Diagnosis not present

## 2023-02-18 DIAGNOSIS — Z882 Allergy status to sulfonamides status: Secondary | ICD-10-CM | POA: Diagnosis not present

## 2023-02-18 DIAGNOSIS — R030 Elevated blood-pressure reading, without diagnosis of hypertension: Secondary | ICD-10-CM | POA: Diagnosis not present

## 2023-02-18 DIAGNOSIS — Z800 Family history of malignant neoplasm of digestive organs: Secondary | ICD-10-CM | POA: Diagnosis not present

## 2023-02-18 DIAGNOSIS — Z820 Family history of epilepsy and other diseases of the nervous system: Secondary | ICD-10-CM | POA: Diagnosis not present

## 2023-02-18 DIAGNOSIS — F902 Attention-deficit hyperactivity disorder, combined type: Secondary | ICD-10-CM | POA: Diagnosis not present

## 2023-02-18 DIAGNOSIS — H269 Unspecified cataract: Secondary | ICD-10-CM | POA: Diagnosis not present

## 2023-03-07 DIAGNOSIS — R69 Illness, unspecified: Secondary | ICD-10-CM | POA: Diagnosis not present

## 2023-03-26 ENCOUNTER — Other Ambulatory Visit: Payer: Self-pay | Admitting: Cardiology

## 2023-04-09 ENCOUNTER — Ambulatory Visit
Admission: RE | Admit: 2023-04-09 | Discharge: 2023-04-09 | Disposition: A | Discharge status: Home / Self Care | Payer: Medicare HMO | Source: Ambulatory Visit | Attending: Family Medicine | Admitting: Family Medicine

## 2023-04-09 DIAGNOSIS — M810 Age-related osteoporosis without current pathological fracture: Secondary | ICD-10-CM | POA: Diagnosis not present

## 2023-04-09 DIAGNOSIS — M85851 Other specified disorders of bone density and structure, right thigh: Secondary | ICD-10-CM | POA: Diagnosis not present

## 2023-04-09 DIAGNOSIS — Z780 Asymptomatic menopausal state: Secondary | ICD-10-CM | POA: Diagnosis not present

## 2023-04-09 DIAGNOSIS — E559 Vitamin D deficiency, unspecified: Secondary | ICD-10-CM

## 2023-05-14 DIAGNOSIS — Z6824 Body mass index (BMI) 24.0-24.9, adult: Secondary | ICD-10-CM | POA: Diagnosis not present

## 2023-05-14 DIAGNOSIS — U071 COVID-19: Secondary | ICD-10-CM | POA: Diagnosis not present

## 2023-05-28 DIAGNOSIS — E7800 Pure hypercholesterolemia, unspecified: Secondary | ICD-10-CM | POA: Diagnosis not present

## 2023-05-28 DIAGNOSIS — E559 Vitamin D deficiency, unspecified: Secondary | ICD-10-CM | POA: Diagnosis not present

## 2023-05-28 DIAGNOSIS — G4710 Hypersomnia, unspecified: Secondary | ICD-10-CM | POA: Diagnosis not present

## 2023-05-28 DIAGNOSIS — R5383 Other fatigue: Secondary | ICD-10-CM | POA: Diagnosis not present

## 2023-05-28 DIAGNOSIS — H539 Unspecified visual disturbance: Secondary | ICD-10-CM | POA: Diagnosis not present

## 2023-05-28 DIAGNOSIS — I2510 Atherosclerotic heart disease of native coronary artery without angina pectoris: Secondary | ICD-10-CM | POA: Diagnosis not present

## 2023-05-28 DIAGNOSIS — M8588 Other specified disorders of bone density and structure, other site: Secondary | ICD-10-CM | POA: Diagnosis not present

## 2023-05-30 DIAGNOSIS — M62830 Muscle spasm of back: Secondary | ICD-10-CM | POA: Diagnosis not present

## 2023-06-19 DIAGNOSIS — M62830 Muscle spasm of back: Secondary | ICD-10-CM | POA: Diagnosis not present

## 2023-06-19 DIAGNOSIS — M7918 Myalgia, other site: Secondary | ICD-10-CM | POA: Diagnosis not present

## 2023-06-24 DIAGNOSIS — M7918 Myalgia, other site: Secondary | ICD-10-CM | POA: Diagnosis not present

## 2023-06-24 DIAGNOSIS — M62830 Muscle spasm of back: Secondary | ICD-10-CM | POA: Diagnosis not present

## 2023-07-09 DIAGNOSIS — M62830 Muscle spasm of back: Secondary | ICD-10-CM | POA: Diagnosis not present

## 2023-07-09 DIAGNOSIS — M7918 Myalgia, other site: Secondary | ICD-10-CM | POA: Diagnosis not present

## 2023-07-14 DIAGNOSIS — G4710 Hypersomnia, unspecified: Secondary | ICD-10-CM | POA: Diagnosis not present

## 2023-07-14 DIAGNOSIS — Z6824 Body mass index (BMI) 24.0-24.9, adult: Secondary | ICD-10-CM | POA: Diagnosis not present

## 2023-07-14 DIAGNOSIS — R051 Acute cough: Secondary | ICD-10-CM | POA: Diagnosis not present

## 2023-09-03 DIAGNOSIS — I2510 Atherosclerotic heart disease of native coronary artery without angina pectoris: Secondary | ICD-10-CM | POA: Diagnosis not present

## 2023-09-03 DIAGNOSIS — G4710 Hypersomnia, unspecified: Secondary | ICD-10-CM | POA: Diagnosis not present

## 2023-09-03 DIAGNOSIS — F900 Attention-deficit hyperactivity disorder, predominantly inattentive type: Secondary | ICD-10-CM | POA: Diagnosis not present

## 2023-09-03 DIAGNOSIS — G4719 Other hypersomnia: Secondary | ICD-10-CM | POA: Diagnosis not present

## 2023-09-03 DIAGNOSIS — G478 Other sleep disorders: Secondary | ICD-10-CM | POA: Diagnosis not present

## 2023-09-03 DIAGNOSIS — R0683 Snoring: Secondary | ICD-10-CM | POA: Diagnosis not present

## 2023-09-24 DIAGNOSIS — G4733 Obstructive sleep apnea (adult) (pediatric): Secondary | ICD-10-CM | POA: Diagnosis not present

## 2023-10-02 DIAGNOSIS — R69 Illness, unspecified: Secondary | ICD-10-CM | POA: Diagnosis not present

## 2023-10-29 DIAGNOSIS — R69 Illness, unspecified: Secondary | ICD-10-CM | POA: Diagnosis not present

## 2023-11-06 DIAGNOSIS — H2513 Age-related nuclear cataract, bilateral: Secondary | ICD-10-CM | POA: Diagnosis not present

## 2023-11-06 DIAGNOSIS — H524 Presbyopia: Secondary | ICD-10-CM | POA: Diagnosis not present

## 2023-11-06 DIAGNOSIS — G4733 Obstructive sleep apnea (adult) (pediatric): Secondary | ICD-10-CM | POA: Diagnosis not present

## 2023-11-26 ENCOUNTER — Encounter: Payer: Self-pay | Admitting: Family Medicine

## 2023-11-26 ENCOUNTER — Other Ambulatory Visit: Payer: Self-pay | Admitting: Family Medicine

## 2023-11-26 DIAGNOSIS — Z0000 Encounter for general adult medical examination without abnormal findings: Secondary | ICD-10-CM

## 2023-11-26 DIAGNOSIS — Z1231 Encounter for screening mammogram for malignant neoplasm of breast: Secondary | ICD-10-CM

## 2023-11-28 ENCOUNTER — Ambulatory Visit
Admission: RE | Admit: 2023-11-28 | Discharge: 2023-11-28 | Disposition: A | Discharge status: Home / Self Care | Payer: Medicare HMO | Source: Ambulatory Visit | Attending: Family Medicine | Admitting: Family Medicine

## 2023-11-28 DIAGNOSIS — Z1231 Encounter for screening mammogram for malignant neoplasm of breast: Secondary | ICD-10-CM

## 2023-12-03 DIAGNOSIS — E7800 Pure hypercholesterolemia, unspecified: Secondary | ICD-10-CM | POA: Diagnosis not present

## 2023-12-03 DIAGNOSIS — N898 Other specified noninflammatory disorders of vagina: Secondary | ICD-10-CM | POA: Diagnosis not present

## 2023-12-06 DIAGNOSIS — G4733 Obstructive sleep apnea (adult) (pediatric): Secondary | ICD-10-CM | POA: Diagnosis not present

## 2023-12-09 DIAGNOSIS — L309 Dermatitis, unspecified: Secondary | ICD-10-CM | POA: Diagnosis not present

## 2023-12-09 DIAGNOSIS — N958 Other specified menopausal and perimenopausal disorders: Secondary | ICD-10-CM | POA: Diagnosis not present

## 2024-01-06 DIAGNOSIS — G4733 Obstructive sleep apnea (adult) (pediatric): Secondary | ICD-10-CM | POA: Diagnosis not present

## 2024-02-24 DIAGNOSIS — H25811 Combined forms of age-related cataract, right eye: Secondary | ICD-10-CM | POA: Diagnosis not present

## 2024-03-04 DIAGNOSIS — M810 Age-related osteoporosis without current pathological fracture: Secondary | ICD-10-CM | POA: Diagnosis not present

## 2024-03-04 DIAGNOSIS — N959 Unspecified menopausal and perimenopausal disorder: Secondary | ICD-10-CM | POA: Diagnosis not present

## 2024-03-04 DIAGNOSIS — E785 Hyperlipidemia, unspecified: Secondary | ICD-10-CM | POA: Diagnosis not present

## 2024-03-04 DIAGNOSIS — F909 Attention-deficit hyperactivity disorder, unspecified type: Secondary | ICD-10-CM | POA: Diagnosis not present

## 2024-03-04 DIAGNOSIS — G8929 Other chronic pain: Secondary | ICD-10-CM | POA: Diagnosis not present

## 2024-03-04 DIAGNOSIS — J45909 Unspecified asthma, uncomplicated: Secondary | ICD-10-CM | POA: Diagnosis not present

## 2024-03-04 DIAGNOSIS — J309 Allergic rhinitis, unspecified: Secondary | ICD-10-CM | POA: Diagnosis not present

## 2024-03-04 DIAGNOSIS — I2510 Atherosclerotic heart disease of native coronary artery without angina pectoris: Secondary | ICD-10-CM | POA: Diagnosis not present

## 2024-03-04 DIAGNOSIS — G4733 Obstructive sleep apnea (adult) (pediatric): Secondary | ICD-10-CM | POA: Diagnosis not present

## 2024-03-05 DIAGNOSIS — G4733 Obstructive sleep apnea (adult) (pediatric): Secondary | ICD-10-CM | POA: Diagnosis not present

## 2024-03-09 DIAGNOSIS — Z961 Presence of intraocular lens: Secondary | ICD-10-CM | POA: Diagnosis not present

## 2024-03-09 DIAGNOSIS — H2512 Age-related nuclear cataract, left eye: Secondary | ICD-10-CM | POA: Diagnosis not present

## 2024-03-09 DIAGNOSIS — H25812 Combined forms of age-related cataract, left eye: Secondary | ICD-10-CM | POA: Diagnosis not present

## 2024-03-22 ENCOUNTER — Other Ambulatory Visit: Payer: Self-pay | Admitting: Cardiology

## 2024-04-05 DIAGNOSIS — G4733 Obstructive sleep apnea (adult) (pediatric): Secondary | ICD-10-CM | POA: Diagnosis not present

## 2024-04-14 ENCOUNTER — Other Ambulatory Visit: Payer: Self-pay | Admitting: Cardiology

## 2024-04-22 ENCOUNTER — Other Ambulatory Visit: Payer: Self-pay | Admitting: Cardiology

## 2024-05-12 ENCOUNTER — Other Ambulatory Visit: Payer: Self-pay

## 2024-05-19 ENCOUNTER — Other Ambulatory Visit: Payer: Self-pay

## 2024-07-01 DIAGNOSIS — L814 Other melanin hyperpigmentation: Secondary | ICD-10-CM | POA: Diagnosis not present

## 2024-07-01 DIAGNOSIS — L570 Actinic keratosis: Secondary | ICD-10-CM | POA: Diagnosis not present

## 2024-07-01 DIAGNOSIS — L821 Other seborrheic keratosis: Secondary | ICD-10-CM | POA: Diagnosis not present

## 2024-07-01 DIAGNOSIS — L500 Allergic urticaria: Secondary | ICD-10-CM | POA: Diagnosis not present

## 2024-07-13 ENCOUNTER — Other Ambulatory Visit: Payer: Self-pay

## 2024-07-14 ENCOUNTER — Encounter: Payer: Self-pay | Admitting: *Deleted

## 2024-07-14 ENCOUNTER — Other Ambulatory Visit: Payer: Self-pay | Admitting: *Deleted

## 2024-07-14 DIAGNOSIS — E785 Hyperlipidemia, unspecified: Secondary | ICD-10-CM

## 2024-07-14 DIAGNOSIS — I10 Essential (primary) hypertension: Secondary | ICD-10-CM

## 2024-07-14 MED ORDER — ROSUVASTATIN CALCIUM 5 MG PO TABS: 5.0000 mg | ORAL_TABLET | Freq: Every day | ORAL | 0 refills | Status: DC

## 2024-07-22 NOTE — Telephone Encounter (Signed)
ALREADY SENT IN

## 2024-07-29 DIAGNOSIS — Z6824 Body mass index (BMI) 24.0-24.9, adult: Secondary | ICD-10-CM | POA: Diagnosis not present

## 2024-07-29 DIAGNOSIS — E559 Vitamin D deficiency, unspecified: Secondary | ICD-10-CM | POA: Diagnosis not present

## 2024-07-29 DIAGNOSIS — G4733 Obstructive sleep apnea (adult) (pediatric): Secondary | ICD-10-CM | POA: Diagnosis not present

## 2024-07-29 DIAGNOSIS — M810 Age-related osteoporosis without current pathological fracture: Secondary | ICD-10-CM | POA: Diagnosis not present

## 2024-07-29 DIAGNOSIS — I2510 Atherosclerotic heart disease of native coronary artery without angina pectoris: Secondary | ICD-10-CM | POA: Diagnosis not present

## 2024-07-29 DIAGNOSIS — E7800 Pure hypercholesterolemia, unspecified: Secondary | ICD-10-CM | POA: Diagnosis not present

## 2024-07-29 DIAGNOSIS — G4710 Hypersomnia, unspecified: Secondary | ICD-10-CM | POA: Diagnosis not present

## 2024-08-03 ENCOUNTER — Other Ambulatory Visit: Payer: Self-pay | Admitting: Cardiology

## 2024-08-09 ENCOUNTER — Other Ambulatory Visit: Payer: Self-pay

## 2024-08-09 DIAGNOSIS — I10 Essential (primary) hypertension: Secondary | ICD-10-CM

## 2024-08-09 DIAGNOSIS — E785 Hyperlipidemia, unspecified: Secondary | ICD-10-CM | POA: Diagnosis not present

## 2024-08-09 LAB — LIPID PANEL

## 2024-08-10 ENCOUNTER — Ambulatory Visit: Payer: Self-pay | Admitting: Cardiology

## 2024-08-10 LAB — COMPREHENSIVE METABOLIC PANEL WITH GFR
ALT: 14 IU/L (ref 0–32)
AST: 21 IU/L (ref 0–40)
Albumin: 4.3 g/dL (ref 3.8–4.8)
Alkaline Phosphatase: 57 IU/L (ref 44–121)
BUN/Creatinine Ratio: 18 (ref 12–28)
BUN: 16 mg/dL (ref 8–27)
Bilirubin Total: 0.3 mg/dL (ref 0.0–1.2)
CO2: 24 mmol/L (ref 20–29)
Calcium: 9.9 mg/dL (ref 8.7–10.3)
Chloride: 103 mmol/L (ref 96–106)
Creatinine, Ser: 0.88 mg/dL (ref 0.57–1.00)
Globulin, Total: 1.6 g/dL (ref 1.5–4.5)
Glucose: 86 mg/dL (ref 70–99)
Potassium: 4.4 mmol/L (ref 3.5–5.2)
Sodium: 142 mmol/L (ref 134–144)
Total Protein: 5.9 g/dL — AB (ref 6.0–8.5)
eGFR: 69 mL/min/1.73 (ref >59)

## 2024-08-10 LAB — CBC
Hematocrit: 42.7 % (ref 34.0–46.6)
Hemoglobin: 14.1 g/dL (ref 11.1–15.9)
MCH: 30.8 pg (ref 26.6–33.0)
MCHC: 33 g/dL (ref 31.5–35.7)
MCV: 93 fL (ref 79–97)
Platelets: 231 x10E3/uL (ref 150–450)
RBC: 4.58 x10E6/uL (ref 3.77–5.28)
RDW: 12.6 % (ref 11.7–15.4)
WBC: 5.2 x10E3/uL (ref 3.4–10.8)

## 2024-08-10 LAB — LIPID PANEL
Cholesterol, Total: 159 mg/dL (ref 100–199)
HDL: 65 mg/dL (ref >39)
LDL CALC COMMENT:: 2.4 ratio (ref 0.0–4.4)
LDL Chol Calc (NIH): 81 mg/dL (ref 0–99)
Triglycerides: 68 mg/dL (ref 0–149)
VLDL Cholesterol Cal: 13 mg/dL (ref 5–40)

## 2024-08-11 NOTE — Progress Notes (Unsigned)
 Cardiology Office Note:    Date:  08/12/2024   ID:  Nichole [name], DOB 08-25-1951, MRN [id number]  PCP:  Katina Pfeiffer, PA-C  Cardiologist:  Lonni LITTIE Nanas, MD  Electrophysiologist:  None   Referring MD: Katina Pfeiffer, PA-C   Chief Complaint  Patient presents with   Coronary Artery Disease    History of Present Illness:    Nichole [name] is a 73 y.o. female with a hx of hyperlipidemia, ADD, asthma who presents for follow-up.  She was referred by Pfeiffer Katina, PA for evaluation of chest pain, initially seen on 01/01/2022.  She reports chest pain started about 6 months ago.  Occurs less than once per week.  Feels like pressure over chest.  Occurred off and on for 45 minutes during recent plane flight.  No clear relationship with exertion.  She plays tennis for exercise, denies exertional symptoms.  Does report dyspnea with exertion when she walks and plays tennis however.  Occasional lightheadedness, denies any syncope.  Denies any lower extremity edema or palpitations.  No smoking history.  No history of heart disease in her immediate family.  Echocardiogram 01/03/2022 showed normal biventricular function, grade 1 diastolic dysfunction, no significant valvular disease.  Coronary CTA on 01/10/2022 showed mild nonobstructive CAD (calcium  score 58, 60th percentile).  Zio patch x 7 days 11/2022 showed no significant arrhythmias.  Echocardiogram 11/2022 showed normal biventricular function, no significant valvular disease.  Normal carotid duplex 11/2022.  Since last clinic visit, she reports she is doing well.  Denies any chest pain, dyspnea, lower extremity edema, or palpitations.  Reports some lightheadedness but denies any syncope.  She is playing tennis twice per week.    Past Medical History:  Diagnosis Date   ADD (attention deficit disorder)    Arthritis    Asthma    allergy induced asthma +   High cholesterol    Osteoarthritis    Pneumonia    hx of pneumonia      Past Surgical History:  Procedure Laterality Date   COLONOSCOPY     PARTIAL KNEE ARTHROPLASTY Left 11/07/2014   Procedure: UNICOMPARTMENTAL LEFT KNEE REPLACEMENT;  Surgeon: Dempsey Melodi GAILS, MD;  Location: [facility];  Service: Orthopedics;  Laterality: Left;   TONSILLECTOMY     age 30    Current Medications: Current Meds  Medication Sig   albuterol  (PROVENTIL  HFA;VENTOLIN  HFA) 108 (90 BASE) MCG/ACT inhaler Inhale 1-2 puffs into the lungs every 6 (six) hours as needed for wheezing or shortness of breath.   Cholecalciferol (VITAMIN D) 50 MCG (2000 UT) CAPS 1 tablet   Coenzyme Q10 (COQ10) 100 MG CAPS Take 1 tablet by mouth daily.   cyanocobalamin  (VITAMIN B12) 1000 MCG tablet Take 1,000 mcg by mouth daily.   lidocaine  4 % Place 1 patch onto the skin daily.   loratadine  (CLARITIN ) 10 MG tablet Take 10 mg by mouth every morning.    methylphenidate  (RITALIN ) 10 MG tablet Take 10 mg by mouth 3 (three) times daily with meals.   rosuvastatin  (CRESTOR ) 10 MG tablet Take 1 tablet (10 mg total) by mouth daily.   [DISCONTINUED] rosuvastatin  (CRESTOR ) 5 MG tablet TAKE 1 TABLET BY MOUTH DAILY **PLEASE CALL OFFICE TO SCHEDULE APPOINTMENT**     Allergies:   Erythromycin, Oseltamivir, and Sulfa antibiotics   Social History   Socioeconomic History   Marital status: Married    Spouse name: [name]   Number of children: 2   Years of education: BS   Highest education level:  Not on file  Occupational History    Employer: [employer]    Comment: self employed  Tobacco Use   Smoking status: Never   Smokeless tobacco: Never  Substance and Sexual Activity   Alcohol use: No   Drug use: No   Sexual activity: Not on file  Other Topics Concern   Not on file  Social History Narrative   Patient is right handed and resides with husband, consumes 2-3 cups of caffeine daily   Social Drivers of Corporate investment banker Strain: Not on file  Food Insecurity: Not on file  Transportation Needs: Not on  file  Physical Activity: Not on file  Stress: Not on file  Social Connections: Not on file     Family History: The patient's family history includes COPD in her father; Cancer in her maternal grandmother and mother; Cancer - Colon in her brother and father; Dementia in her maternal grandfather and mother; Fibromyalgia in her sister; Hepatitis C in her brother; Neuropathy (age of onset: 16) in her son.  ROS:   Please see the history of present illness.     All other systems reviewed and are negative.  EKGs/Labs/Other Studies Reviewed:    The following studies were reviewed today:   EKG:  07/16/2022: Normal sinus rhythm, rate 71, poor R wave progression 08/12/2024: Normal sinus rhythm, rate 76, no ST abnormalities  Recent Labs: 08/09/2024: ALT 14; BUN 16; Creatinine, Ser 0.88; Hemoglobin 14.1; Platelets 231; Potassium 4.4; Sodium 142  Recent Lipid Panel    Component Value Date/Time   CHOL 159 08/09/2024 0832   TRIG 68 08/09/2024 0832   HDL 65 08/09/2024 0832   CHOLHDL 2.4 08/09/2024 0832   LDLCALC 81 08/09/2024 0832    Physical Exam:    VS:  BP 114/76 (BP Location: Left Arm, Patient Position: Sitting, Cuff Size: Normal)   Pulse 76   Ht 5' 5 (1.651 m)   Wt 147 lb 9.6 oz (67 kg)   SpO2 93%   BMI 24.56 kg/m     Wt Readings from Last 3 Encounters:  08/12/24 147 lb 9.6 oz (67 kg)  01/17/23 146 lb (66.2 kg)  12/07/22 145 lb (65.8 kg)     GEN:  Well nourished, well developed in no acute distress HEENT: Normal NECK: No JVD; No carotid bruits LYMPHATICS: No lymphadenopathy CARDIAC: RRR, no murmurs, rubs, gallops RESPIRATORY:  Clear to auscultation without rales, wheezing or rhonchi  ABDOMEN: Soft, non-tender, non-distended MUSCULOSKELETAL:  No edema; No deformity  SKIN: Warm and dry NEUROLOGIC:  Alert and oriented x 3 PSYCHIATRIC:  Normal affect   ASSESSMENT:    1. Coronary artery disease involving native coronary artery of native heart without angina pectoris   2.  Hyperlipidemia, unspecified hyperlipidemia type   3. Pre-syncope      PLAN:    Chest pain/DOE: Atypical chest pain by description.  Echocardiogram 01/03/2022 showed normal biventricular function, grade 1 diastolic dysfunction, no significant valvular disease.  Coronary CTA on 01/10/2022 showed mild nonobstructive CAD (calcium  score 58, 60th percentile). - Reports no recent chest pain or dyspnea  Hyperlipidemia: LDL 146 on 06/20/2021.  Started rosuvastatin  5 mg daily, LDL 81 on 08/09/2024.  Goal LDL less than 70 given coronary CTA results as above.  Recommend increasing rosuvastatin  to 10 mg daily and check fasting lipid panel in 3 months  Near syncope: Zio patch x 7 days 11/2022 showed no significant arrhythmias.  Echocardiogram 11/2022 showed normal biventricular function, no significant valvular disease.  Normal carotid duplex 11/2022.   RTC in 1 year  Medication Adjustments/Labs and Tests Ordered: Current medicines are reviewed at length with the patient today.  Concerns regarding medicines are outlined above.  Orders Placed This Encounter  Procedures   Lipid panel   EKG 12-Lead   Meds ordered this encounter  Medications   rosuvastatin  (CRESTOR ) 10 MG tablet    Sig: Take 1 tablet (10 mg total) by mouth daily.    Dispense:  90 tablet    Refill:  3    Patient Instructions  Medication Instructions:  Increase Rosuvastatin  10mg  daily Continue all current medications *If you need a refill on your cardiac medications before your next appointment, please call your pharmacy*  Lab Work: Fasting Lipid panel 3 months If you have labs (blood work) drawn today and your tests are completely normal, you will receive your results only by: MyChart Message (if you have MyChart) OR A paper copy in the mail If you have any lab test that is abnormal or we need to change your treatment, we will call you to review the results.  Testing/Procedures: none  Follow-Up: At [facility],  you and your health needs are our priority.  As part of our continuing mission to provide you with exceptional heart care, our providers are all part of one team.  This team includes your primary Cardiologist (physician) and Advanced Practice Providers or APPs (Physician Assistants and Nurse Practitioners) who all work together to provide you with the care you need, when you need it.  Your next appointment:   1 year  Provider:   Dr. Kate  We recommend signing up for the patient portal called MyChart.  Sign up information is provided on this After Visit Summary.  MyChart is used to connect with patients for Virtual Visits (Telemedicine).  Patients are able to view lab/test results, encounter notes, upcoming appointments, etc.  Non-urgent messages can be sent to your provider as well.   To learn more about what you can do with MyChart, go to [email or web address].   Other Instructions none       Signed, Lonni LITTIE Kate, MD  08/12/2024 5:43 PM    [facility]

## 2024-08-12 ENCOUNTER — Ambulatory Visit: Payer: Self-pay | Attending: Cardiology | Admitting: Cardiology

## 2024-08-12 ENCOUNTER — Encounter: Payer: Self-pay | Admitting: Cardiology

## 2024-08-12 VITALS — BP 114/76 | HR 76 | Ht 65.0 in | Wt 147.6 lb

## 2024-08-12 DIAGNOSIS — E785 Hyperlipidemia, unspecified: Secondary | ICD-10-CM

## 2024-08-12 DIAGNOSIS — R55 Syncope and collapse: Secondary | ICD-10-CM

## 2024-08-12 DIAGNOSIS — I2510 Atherosclerotic heart disease of native coronary artery without angina pectoris: Secondary | ICD-10-CM

## 2024-08-12 MED ORDER — ROSUVASTATIN CALCIUM 10 MG PO TABS: 10.0000 mg | ORAL_TABLET | Freq: Every day | ORAL | 3 refills | Status: AC

## 2024-08-12 NOTE — Patient Instructions (Signed)
 Medication Instructions:  Increase Rosuvastatin  10mg  daily Continue all current medications *If you need a refill on your cardiac medications before your next appointment, please call your pharmacy*  Lab Work: Fasting Lipid panel 3 months If you have labs (blood work) drawn today and your tests are completely normal, you will receive your results only by: MyChart Message (if you have MyChart) OR A paper copy in the mail If you have any lab test that is abnormal or we need to change your treatment, we will call you to review the results.  Testing/Procedures: none  Follow-Up: At [facility], you and your health needs are our priority.  As part of our continuing mission to provide you with exceptional heart care, our providers are all part of one team.  This team includes your primary Cardiologist (physician) and Advanced Practice Providers or APPs (Physician Assistants and Nurse Practitioners) who all work together to provide you with the care you need, when you need it.  Your next appointment:   1 year  Provider:   Dr. Kate  We recommend signing up for the patient portal called MyChart.  Sign up information is provided on this After Visit Summary.  MyChart is used to connect with patients for Virtual Visits (Telemedicine).  Patients are able to view lab/test results, encounter notes, upcoming appointments, etc.  Non-urgent messages can be sent to your provider as well.   To learn more about what you can do with MyChart, go to [email or web address].   Other Instructions none

## 2024-08-16 NOTE — Telephone Encounter (Signed)
 Called patient and made patient aware that she is able to view after clinical notes and summary and talk patient through process. Patient verbalized an understanding.

## 2024-08-31 DIAGNOSIS — L570 Actinic keratosis: Secondary | ICD-10-CM | POA: Diagnosis not present

## 2024-08-31 DIAGNOSIS — L239 Allergic contact dermatitis, unspecified cause: Secondary | ICD-10-CM | POA: Diagnosis not present

## 2024-09-05 DIAGNOSIS — G4733 Obstructive sleep apnea (adult) (pediatric): Secondary | ICD-10-CM | POA: Diagnosis not present

## 2024-09-07 ENCOUNTER — Other Ambulatory Visit: Payer: Self-pay | Admitting: Cardiology

## 2024-09-07 DIAGNOSIS — G4733 Obstructive sleep apnea (adult) (pediatric): Secondary | ICD-10-CM | POA: Diagnosis not present

## 2024-09-15 DIAGNOSIS — M25531 Pain in right wrist: Secondary | ICD-10-CM | POA: Diagnosis not present

## 2024-10-13 DIAGNOSIS — H0012 Chalazion right lower eyelid: Secondary | ICD-10-CM | POA: Diagnosis not present

## 2024-10-13 DIAGNOSIS — H0015 Chalazion left lower eyelid: Secondary | ICD-10-CM | POA: Diagnosis not present

## 2024-11-05 DIAGNOSIS — G4733 Obstructive sleep apnea (adult) (pediatric): Secondary | ICD-10-CM | POA: Diagnosis not present

## 2024-11-24 DIAGNOSIS — G5602 Carpal tunnel syndrome, left upper limb: Secondary | ICD-10-CM | POA: Diagnosis not present

## 2024-11-24 DIAGNOSIS — M25532 Pain in left wrist: Secondary | ICD-10-CM | POA: Diagnosis not present

## 2024-11-25 DIAGNOSIS — L570 Actinic keratosis: Secondary | ICD-10-CM | POA: Diagnosis not present

## 2024-11-25 DIAGNOSIS — L6611 Classic lichen planopilaris: Secondary | ICD-10-CM | POA: Diagnosis not present

## 2024-11-25 DIAGNOSIS — L819 Disorder of pigmentation, unspecified: Secondary | ICD-10-CM | POA: Diagnosis not present

## 2024-11-25 DIAGNOSIS — L438 Other lichen planus: Secondary | ICD-10-CM | POA: Diagnosis not present

## 2024-11-25 DIAGNOSIS — L814 Other melanin hyperpigmentation: Secondary | ICD-10-CM | POA: Diagnosis not present

## 2024-11-25 DIAGNOSIS — D225 Melanocytic nevi of trunk: Secondary | ICD-10-CM | POA: Diagnosis not present

## 2024-11-25 DIAGNOSIS — L738 Other specified follicular disorders: Secondary | ICD-10-CM | POA: Diagnosis not present

## 2024-11-25 DIAGNOSIS — L918 Other hypertrophic disorders of the skin: Secondary | ICD-10-CM | POA: Diagnosis not present

## 2024-11-25 DIAGNOSIS — D224 Melanocytic nevi of scalp and neck: Secondary | ICD-10-CM | POA: Diagnosis not present

## 2024-11-25 DIAGNOSIS — L821 Other seborrheic keratosis: Secondary | ICD-10-CM | POA: Diagnosis not present
